# Patient Record
Sex: Female | Born: 1935 | Race: Black or African American | Hispanic: No | Marital: Single | Smoking: Former smoker
Health system: Southern US, Community
[De-identification: ages and names within clinical notes are randomized; demographics above are authoritative.]

## PROBLEM LIST (undated history)

## (undated) DIAGNOSIS — E876 Hypokalemia: Secondary | ICD-10-CM

## (undated) DIAGNOSIS — F0390 Unspecified dementia without behavioral disturbance: Secondary | ICD-10-CM

## (undated) DIAGNOSIS — G40909 Epilepsy, unspecified, not intractable, without status epilepticus: Secondary | ICD-10-CM

## (undated) DIAGNOSIS — F419 Anxiety disorder, unspecified: Secondary | ICD-10-CM

## (undated) DIAGNOSIS — D509 Iron deficiency anemia, unspecified: Secondary | ICD-10-CM

## (undated) DIAGNOSIS — K635 Polyp of colon: Secondary | ICD-10-CM

## (undated) DIAGNOSIS — E785 Hyperlipidemia, unspecified: Secondary | ICD-10-CM

## (undated) DIAGNOSIS — F329 Major depressive disorder, single episode, unspecified: Secondary | ICD-10-CM

## (undated) DIAGNOSIS — I10 Essential (primary) hypertension: Secondary | ICD-10-CM

## (undated) DIAGNOSIS — F32A Depression, unspecified: Secondary | ICD-10-CM

## (undated) HISTORY — DX: Epilepsy, unspecified, not intractable, without status epilepticus: G40.909

## (undated) HISTORY — DX: Anxiety disorder, unspecified: F41.9

## (undated) HISTORY — DX: Iron deficiency anemia, unspecified: D50.9

## (undated) HISTORY — DX: Depression, unspecified: F32.A

## (undated) HISTORY — DX: Hypokalemia: E87.6

## (undated) HISTORY — DX: Polyp of colon: K63.5

## (undated) HISTORY — DX: Major depressive disorder, single episode, unspecified: F32.9

## (undated) HISTORY — DX: Hyperlipidemia, unspecified: E78.5

---

## 2007-09-16 ENCOUNTER — Emergency Department (HOSPITAL_COMMUNITY): Admission: EM | Admit: 2007-09-16 | Discharge: 2007-09-16 | Payer: Self-pay | Admitting: Emergency Medicine

## 2009-10-23 DIAGNOSIS — D509 Iron deficiency anemia, unspecified: Secondary | ICD-10-CM

## 2009-10-23 HISTORY — DX: Iron deficiency anemia, unspecified: D50.9

## 2009-11-05 ENCOUNTER — Inpatient Hospital Stay (HOSPITAL_COMMUNITY): Admission: EM | Admit: 2009-11-05 | Discharge: 2009-11-08 | Payer: Self-pay | Admitting: Emergency Medicine

## 2010-04-07 ENCOUNTER — Emergency Department (HOSPITAL_COMMUNITY): Admission: EM | Admit: 2010-04-07 | Discharge: 2010-04-07 | Payer: Self-pay | Admitting: Emergency Medicine

## 2010-05-06 ENCOUNTER — Emergency Department (HOSPITAL_COMMUNITY): Admission: EM | Admit: 2010-05-06 | Discharge: 2010-05-06 | Payer: Self-pay | Admitting: Emergency Medicine

## 2010-07-19 ENCOUNTER — Emergency Department (HOSPITAL_COMMUNITY)
Admission: EM | Admit: 2010-07-19 | Discharge: 2010-07-19 | Payer: Self-pay | Source: Home / Self Care | Admitting: Emergency Medicine

## 2010-10-10 ENCOUNTER — Inpatient Hospital Stay (HOSPITAL_COMMUNITY)
Admission: EM | Admit: 2010-10-10 | Discharge: 2010-10-13 | Payer: Self-pay | Source: Home / Self Care | Attending: Internal Medicine | Admitting: Internal Medicine

## 2010-10-11 ENCOUNTER — Encounter (INDEPENDENT_AMBULATORY_CARE_PROVIDER_SITE_OTHER): Payer: Self-pay | Admitting: Internal Medicine

## 2010-10-12 ENCOUNTER — Encounter (INDEPENDENT_AMBULATORY_CARE_PROVIDER_SITE_OTHER): Payer: Self-pay | Admitting: Internal Medicine

## 2010-11-13 ENCOUNTER — Encounter: Payer: Self-pay | Admitting: Family Medicine

## 2011-01-02 LAB — URINALYSIS, ROUTINE W REFLEX MICROSCOPIC
Bilirubin Urine: NEGATIVE
Ketones, ur: NEGATIVE mg/dL
Nitrite: NEGATIVE
Protein, ur: NEGATIVE mg/dL

## 2011-01-02 LAB — CBC
HCT: 31.8 % — ABNORMAL LOW (ref 36.0–46.0)
Hemoglobin: 9.8 g/dL — ABNORMAL LOW (ref 12.0–15.0)
MCHC: 30.8 g/dL (ref 30.0–36.0)
MCHC: 31.5 g/dL (ref 30.0–36.0)
MCV: 74.2 fL — ABNORMAL LOW (ref 78.0–100.0)
Platelets: 385 10*3/uL (ref 150–400)
RBC: 4.28 MIL/uL (ref 3.87–5.11)
RDW: 17.3 % — ABNORMAL HIGH (ref 11.5–15.5)
WBC: 5.3 10*3/uL (ref 4.0–10.5)
WBC: 8.8 10*3/uL (ref 4.0–10.5)

## 2011-01-02 LAB — BASIC METABOLIC PANEL
BUN: 13 mg/dL (ref 6–23)
BUN: 16 mg/dL (ref 6–23)
CO2: 29 mEq/L (ref 19–32)
Creatinine, Ser: 0.71 mg/dL (ref 0.4–1.2)
GFR calc non Af Amer: >60 mL/min (ref >60)
GFR calc non Af Amer: >60 mL/min (ref >60)
Glucose, Bld: 148 mg/dL — ABNORMAL HIGH (ref 70–99)
Glucose, Bld: 72 mg/dL (ref 70–99)
Potassium: 3.9 mEq/L (ref 3.5–5.1)

## 2011-01-02 LAB — CK TOTAL AND CKMB (NOT AT ARMC)
CK, MB: 1.2 ng/mL (ref 0.3–4.0)
Total CK: 48 U/L (ref 7–177)

## 2011-01-02 LAB — CARDIAC PANEL(CRET KIN+CKTOT+MB+TROPI)
CK, MB: 1 ng/mL (ref 0.3–4.0)
CK, MB: 1.3 ng/mL (ref 0.3–4.0)
Relative Index: INVALID (ref 0.0–2.5)
Total CK: 51 U/L (ref 7–177)

## 2011-01-02 LAB — HEMOGLOBIN A1C
Hgb A1c MFr Bld: 6.6 % — ABNORMAL HIGH (ref <5.7)
Mean Plasma Glucose: 143 mg/dL — ABNORMAL HIGH (ref <117)

## 2011-01-02 LAB — DIFFERENTIAL
Basophils Absolute: 0.1 10*3/uL (ref 0.0–0.1)
Basophils Relative: 1 % (ref 0–1)
Eosinophils Relative: 1 % (ref 0–5)
Lymphocytes Relative: 48 % — ABNORMAL HIGH (ref 12–46)
Monocytes Absolute: 0.8 10*3/uL (ref 0.1–1.0)
Neutro Abs: 3.7 10*3/uL (ref 1.7–7.7)

## 2011-01-02 LAB — GLUCOSE, CAPILLARY
Glucose-Capillary: 102 mg/dL — ABNORMAL HIGH (ref 70–99)
Glucose-Capillary: 135 mg/dL — ABNORMAL HIGH (ref 70–99)
Glucose-Capillary: 148 mg/dL — ABNORMAL HIGH (ref 70–99)
Glucose-Capillary: 188 mg/dL — ABNORMAL HIGH (ref 70–99)
Glucose-Capillary: 90 mg/dL (ref 70–99)
Glucose-Capillary: 91 mg/dL (ref 70–99)
Glucose-Capillary: 94 mg/dL (ref 70–99)

## 2011-01-02 LAB — POCT CARDIAC MARKERS: Troponin i, poc: <0.05 ng/mL (ref 0.00–0.09)

## 2011-01-02 LAB — ALT: ALT: 12 U/L (ref 0–35)

## 2011-01-02 LAB — MRSA PCR SCREENING: MRSA by PCR: NEGATIVE

## 2011-01-02 LAB — AST: AST: 17 U/L (ref 0–37)

## 2011-01-05 LAB — URINALYSIS, ROUTINE W REFLEX MICROSCOPIC
Bilirubin Urine: NEGATIVE
Hgb urine dipstick: NEGATIVE
Ketones, ur: NEGATIVE mg/dL
Nitrite: NEGATIVE
Protein, ur: NEGATIVE mg/dL
Urobilinogen, UA: 1 mg/dL (ref 0.0–1.0)

## 2011-01-05 LAB — DIFFERENTIAL
Basophils Absolute: 0 10*3/uL (ref 0.0–0.1)
Lymphocytes Relative: 21 % (ref 12–46)
Monocytes Absolute: 0.6 10*3/uL (ref 0.1–1.0)
Neutro Abs: 3.9 10*3/uL (ref 1.7–7.7)
Neutrophils Relative %: 68 % (ref 43–77)

## 2011-01-05 LAB — BASIC METABOLIC PANEL
BUN: 13 mg/dL (ref 6–23)
CO2: 31 mEq/L (ref 19–32)
Chloride: 105 mEq/L (ref 96–112)
Glucose, Bld: 123 mg/dL — ABNORMAL HIGH (ref 70–99)
Potassium: 3.6 mEq/L (ref 3.5–5.1)
Sodium: 142 mEq/L (ref 135–145)

## 2011-01-05 LAB — POCT CARDIAC MARKERS
CKMB, poc: <1 ng/mL — ABNORMAL LOW (ref 1.0–8.0)
Myoglobin, poc: 59.9 ng/mL (ref 12–200)
Troponin i, poc: <0.05 ng/mL (ref 0.00–0.09)

## 2011-01-05 LAB — CBC
HCT: 32.7 % — ABNORMAL LOW (ref 36.0–46.0)
Hemoglobin: 10.6 g/dL — ABNORMAL LOW (ref 12.0–15.0)
MCHC: 32.5 g/dL (ref 30.0–36.0)
MCV: 77.3 fL — ABNORMAL LOW (ref 78.0–100.0)
RDW: 17.1 % — ABNORMAL HIGH (ref 11.5–15.5)

## 2011-01-07 LAB — COMPREHENSIVE METABOLIC PANEL
ALT: 11 U/L (ref 0–35)
AST: 16 U/L (ref 0–37)
CO2: 30 mEq/L (ref 19–32)
Calcium: 9.2 mg/dL (ref 8.4–10.5)
Chloride: 99 mEq/L (ref 96–112)
Creatinine, Ser: 0.69 mg/dL (ref 0.4–1.2)
GFR calc non Af Amer: >60 mL/min (ref >60)
Glucose, Bld: 120 mg/dL — ABNORMAL HIGH (ref 70–99)
Total Bilirubin: 0.3 mg/dL (ref 0.3–1.2)

## 2011-01-07 LAB — CBC
MCH: 25 pg — ABNORMAL LOW (ref 26.0–34.0)
MCHC: 32 g/dL (ref 30.0–36.0)
MCV: 78.1 fL (ref 78.0–100.0)
Platelets: 314 10*3/uL (ref 150–400)

## 2011-01-07 LAB — VALPROIC ACID LEVEL: Valproic Acid Lvl: 28.6 ug/mL — ABNORMAL LOW (ref 50.0–100.0)

## 2011-01-07 LAB — DIFFERENTIAL
Basophils Absolute: 0 10*3/uL (ref 0.0–0.1)
Eosinophils Absolute: 0 10*3/uL (ref 0.0–0.7)
Eosinophils Relative: 1 % (ref 0–5)
Lymphs Abs: 1.5 10*3/uL (ref 0.7–4.0)
Neutrophils Relative %: 69 % (ref 43–77)

## 2011-01-07 LAB — TROPONIN I: Troponin I: 0.01 ng/mL (ref 0.00–0.06)

## 2011-01-07 LAB — CK TOTAL AND CKMB (NOT AT ARMC): Relative Index: INVALID (ref 0.0–2.5)

## 2011-01-08 LAB — POCT CARDIAC MARKERS
CKMB, poc: 1.2 ng/mL (ref 1.0–8.0)
CKMB, poc: <1 ng/mL — ABNORMAL LOW (ref 1.0–8.0)
Myoglobin, poc: 133 ng/mL (ref 12–200)
Myoglobin, poc: 42 ng/mL (ref 12–200)

## 2011-01-08 LAB — CBC
Hemoglobin: 9.6 g/dL — ABNORMAL LOW (ref 12.0–15.0)
MCHC: 31.6 g/dL (ref 30.0–36.0)
MCV: 77.7 fL — ABNORMAL LOW (ref 78.0–100.0)
Platelets: 302 10*3/uL (ref 150–400)
Platelets: 357 10*3/uL (ref 150–400)
RDW: 17.9 % — ABNORMAL HIGH (ref 11.5–15.5)
WBC: 6.5 10*3/uL (ref 4.0–10.5)

## 2011-01-08 LAB — IRON AND TIBC
Iron: <10 ug/dL — ABNORMAL LOW (ref 42–135)
UIBC: 360 ug/dL

## 2011-01-08 LAB — BASIC METABOLIC PANEL
BUN: 14 mg/dL (ref 6–23)
Calcium: 9 mg/dL (ref 8.4–10.5)
Creatinine, Ser: 0.68 mg/dL (ref 0.4–1.2)
GFR calc Af Amer: >60 mL/min (ref >60)
GFR calc non Af Amer: >60 mL/min (ref >60)

## 2011-01-08 LAB — GLUCOSE, CAPILLARY
Glucose-Capillary: 113 mg/dL — ABNORMAL HIGH (ref 70–99)
Glucose-Capillary: 114 mg/dL — ABNORMAL HIGH (ref 70–99)
Glucose-Capillary: 158 mg/dL — ABNORMAL HIGH (ref 70–99)

## 2011-01-08 LAB — FOLATE: Folate: 16.3 ng/mL

## 2011-01-08 LAB — URINALYSIS, ROUTINE W REFLEX MICROSCOPIC
Glucose, UA: NEGATIVE mg/dL
Glucose, UA: NEGATIVE mg/dL
Hgb urine dipstick: NEGATIVE
Protein, ur: 30 mg/dL — AB
Protein, ur: NEGATIVE mg/dL
Specific Gravity, Urine: 1.019 (ref 1.005–1.030)
Urobilinogen, UA: 1 mg/dL (ref 0.0–1.0)

## 2011-01-08 LAB — DIFFERENTIAL
Basophils Relative: 0 % (ref 0–1)
Eosinophils Absolute: 0 10*3/uL (ref 0.0–0.7)
Eosinophils Absolute: 0.1 10*3/uL (ref 0.0–0.7)
Eosinophils Relative: 0 % (ref 0–5)
Lymphs Abs: 1.6 10*3/uL (ref 0.7–4.0)
Lymphs Abs: 2.5 10*3/uL (ref 0.7–4.0)
Monocytes Absolute: 1 10*3/uL (ref 0.1–1.0)
Monocytes Relative: 14 % — ABNORMAL HIGH (ref 3–12)
Neutro Abs: 3 10*3/uL (ref 1.7–7.7)
Neutrophils Relative %: 65 % (ref 43–77)
Neutrophils Relative %: 47 % (ref 43–77)

## 2011-01-08 LAB — URINE MICROSCOPIC-ADD ON

## 2011-01-08 LAB — RETICULOCYTES: RBC.: 4.25 MIL/uL (ref 3.87–5.11)

## 2011-01-08 LAB — URINE CULTURE: Culture: NO GROWTH

## 2011-01-08 LAB — COMPREHENSIVE METABOLIC PANEL
ALT: 19 U/L (ref 0–35)
AST: 28 U/L (ref 0–37)
Albumin: 3.5 g/dL (ref 3.5–5.2)
Alkaline Phosphatase: 71 U/L (ref 39–117)
Calcium: 8.8 mg/dL (ref 8.4–10.5)
GFR calc Af Amer: 54 mL/min — ABNORMAL LOW (ref >60)
Potassium: 3.7 mEq/L (ref 3.5–5.1)
Sodium: 141 mEq/L (ref 135–145)
Total Protein: 6.5 g/dL (ref 6.0–8.3)

## 2011-01-08 LAB — FERRITIN: Ferritin: 26 ng/mL (ref 10–291)

## 2011-01-09 LAB — GLUCOSE, CAPILLARY
Glucose-Capillary: 121 mg/dL — ABNORMAL HIGH (ref 70–99)
Glucose-Capillary: 89 mg/dL (ref 70–99)

## 2011-01-09 LAB — BASIC METABOLIC PANEL
Calcium: 8.6 mg/dL (ref 8.4–10.5)
GFR calc Af Amer: >60 mL/min (ref >60)
GFR calc non Af Amer: >60 mL/min (ref >60)
Sodium: 144 mEq/L (ref 135–145)

## 2011-01-09 LAB — POTASSIUM: Potassium: 3.9 mEq/L (ref 3.5–5.1)

## 2011-08-01 LAB — I-STAT 8, (EC8 V) (CONVERTED LAB)
BUN: 8
Chloride: 106
HCT: 40
Hemoglobin: 13.6
Operator id: 198171
Potassium: 3.4 — ABNORMAL LOW
Sodium: 140
pCO2, Ven: 44.9 — ABNORMAL LOW

## 2011-08-01 LAB — DIFFERENTIAL
Eosinophils Absolute: 0 — ABNORMAL LOW
Eosinophils Relative: 1
Lymphs Abs: 1.7
Monocytes Absolute: 0.5

## 2011-08-01 LAB — CBC
HCT: 36.2
MCV: 77.8 — ABNORMAL LOW
Platelets: 332
WBC: 5.4

## 2011-08-01 LAB — POCT I-STAT CREATININE
Creatinine, Ser: 1
Operator id: 198171

## 2011-08-01 LAB — POCT CARDIAC MARKERS: Operator id: 198171

## 2011-08-01 LAB — URINALYSIS, ROUTINE W REFLEX MICROSCOPIC
Glucose, UA: NEGATIVE
Hgb urine dipstick: NEGATIVE
Ketones, ur: NEGATIVE
Protein, ur: NEGATIVE

## 2011-08-27 ENCOUNTER — Other Ambulatory Visit: Payer: Self-pay

## 2011-08-27 ENCOUNTER — Inpatient Hospital Stay (HOSPITAL_COMMUNITY)
Admission: EM | Admit: 2011-08-27 | Discharge: 2011-09-01 | DRG: 312 | Disposition: A | Discharge status: Skilled Nursing Facility | Payer: Medicare Other | Attending: Internal Medicine | Admitting: Internal Medicine

## 2011-08-27 ENCOUNTER — Encounter: Payer: Self-pay | Admitting: Emergency Medicine

## 2011-08-27 ENCOUNTER — Emergency Department (HOSPITAL_COMMUNITY): Payer: Medicare Other

## 2011-08-27 DIAGNOSIS — Z7982 Long term (current) use of aspirin: Secondary | ICD-10-CM

## 2011-08-27 DIAGNOSIS — Y998 Other external cause status: Secondary | ICD-10-CM

## 2011-08-27 DIAGNOSIS — E1169 Type 2 diabetes mellitus with other specified complication: Secondary | ICD-10-CM | POA: Diagnosis not present

## 2011-08-27 DIAGNOSIS — G40909 Epilepsy, unspecified, not intractable, without status epilepticus: Secondary | ICD-10-CM | POA: Diagnosis present

## 2011-08-27 DIAGNOSIS — W1809XA Striking against other object with subsequent fall, initial encounter: Secondary | ICD-10-CM | POA: Diagnosis present

## 2011-08-27 DIAGNOSIS — Z9181 History of falling: Secondary | ICD-10-CM

## 2011-08-27 DIAGNOSIS — Z23 Encounter for immunization: Secondary | ICD-10-CM

## 2011-08-27 DIAGNOSIS — R55 Syncope and collapse: Principal | ICD-10-CM | POA: Diagnosis present

## 2011-08-27 DIAGNOSIS — R5381 Other malaise: Secondary | ICD-10-CM | POA: Diagnosis present

## 2011-08-27 DIAGNOSIS — F0390 Unspecified dementia without behavioral disturbance: Secondary | ICD-10-CM | POA: Diagnosis present

## 2011-08-27 DIAGNOSIS — I498 Other specified cardiac arrhythmias: Secondary | ICD-10-CM | POA: Diagnosis present

## 2011-08-27 DIAGNOSIS — R32 Unspecified urinary incontinence: Secondary | ICD-10-CM | POA: Diagnosis present

## 2011-08-27 DIAGNOSIS — Y92009 Unspecified place in unspecified non-institutional (private) residence as the place of occurrence of the external cause: Secondary | ICD-10-CM

## 2011-08-27 DIAGNOSIS — D509 Iron deficiency anemia, unspecified: Secondary | ICD-10-CM | POA: Diagnosis present

## 2011-08-27 DIAGNOSIS — I10 Essential (primary) hypertension: Secondary | ICD-10-CM | POA: Diagnosis present

## 2011-08-27 DIAGNOSIS — E876 Hypokalemia: Secondary | ICD-10-CM | POA: Diagnosis present

## 2011-08-27 DIAGNOSIS — IMO0002 Reserved for concepts with insufficient information to code with codable children: Secondary | ICD-10-CM | POA: Diagnosis present

## 2011-08-27 DIAGNOSIS — R4182 Altered mental status, unspecified: Secondary | ICD-10-CM | POA: Diagnosis present

## 2011-08-27 DIAGNOSIS — Z79899 Other long term (current) drug therapy: Secondary | ICD-10-CM

## 2011-08-27 HISTORY — DX: Unspecified dementia, unspecified severity, without behavioral disturbance, psychotic disturbance, mood disturbance, and anxiety: F03.90

## 2011-08-27 HISTORY — DX: Essential (primary) hypertension: I10

## 2011-08-27 LAB — CBC
HCT: 33.1 % — ABNORMAL LOW (ref 36.0–46.0)
HCT: 33.9 % — ABNORMAL LOW (ref 36.0–46.0)
Hemoglobin: 10.3 g/dL — ABNORMAL LOW (ref 12.0–15.0)
MCH: 22.8 pg — ABNORMAL LOW (ref 26.0–34.0)
MCHC: 31.1 g/dL (ref 30.0–36.0)
MCV: 72.7 fL — ABNORMAL LOW (ref 78.0–100.0)
Platelets: 420 10*3/uL — ABNORMAL HIGH (ref 150–400)
RBC: 4.52 MIL/uL (ref 3.87–5.11)
RBC: 4.66 MIL/uL (ref 3.87–5.11)
RDW: 16.7 % — ABNORMAL HIGH (ref 11.5–15.5)
WBC: 6.6 10*3/uL (ref 4.0–10.5)

## 2011-08-27 LAB — LIPID PANEL
HDL: 79 mg/dL (ref >39)
LDL Cholesterol: 107 mg/dL — ABNORMAL HIGH (ref 0–99)
Total CHOL/HDL Ratio: 2.5 RATIO
Triglycerides: 74 mg/dL (ref <150)
VLDL: 15 mg/dL (ref 0–40)

## 2011-08-27 LAB — CARDIAC PANEL(CRET KIN+CKTOT+MB+TROPI)
CK, MB: 1.8 ng/mL (ref 0.3–4.0)
Relative Index: INVALID (ref 0.0–2.5)
Total CK: 58 U/L (ref 7–177)
Troponin I: <0.3 ng/mL (ref <0.30)

## 2011-08-27 LAB — POCT I-STAT, CHEM 8
BUN: 14 mg/dL (ref 6–23)
Calcium, Ion: 1.16 mmol/L (ref 1.12–1.32)
HCT: 35 % — ABNORMAL LOW (ref 36.0–46.0)
Hemoglobin: 11.9 g/dL — ABNORMAL LOW (ref 12.0–15.0)
Sodium: 142 mEq/L (ref 135–145)
TCO2: 29 mmol/L (ref 0–100)

## 2011-08-27 LAB — URINALYSIS, ROUTINE W REFLEX MICROSCOPIC
Glucose, UA: NEGATIVE mg/dL
Ketones, ur: NEGATIVE mg/dL
Leukocytes, UA: NEGATIVE
Protein, ur: NEGATIVE mg/dL
pH: 7.5 (ref 5.0–8.0)

## 2011-08-27 LAB — PROTIME-INR: INR: 0.93 (ref 0.00–1.49)

## 2011-08-27 LAB — COMPREHENSIVE METABOLIC PANEL
AST: 18 U/L (ref 0–37)
Albumin: 3.7 g/dL (ref 3.5–5.2)
Alkaline Phosphatase: 111 U/L (ref 39–117)
CO2: 28 mEq/L (ref 19–32)
Chloride: 101 mEq/L (ref 96–112)
GFR calc non Af Amer: >90 mL/min (ref >90)
Potassium: 3.3 mEq/L — ABNORMAL LOW (ref 3.5–5.1)
Total Bilirubin: 0.2 mg/dL — ABNORMAL LOW (ref 0.3–1.2)

## 2011-08-27 LAB — URINE MICROSCOPIC-ADD ON

## 2011-08-27 LAB — VALPROIC ACID LEVEL: Valproic Acid Lvl: 11.9 ug/mL — ABNORMAL LOW (ref 50.0–100.0)

## 2011-08-27 LAB — POCT I-STAT TROPONIN I: Troponin i, poc: 0.01 ng/mL (ref 0.00–0.08)

## 2011-08-27 LAB — OCCULT BLOOD, POC DEVICE: Fecal Occult Bld: NEGATIVE

## 2011-08-27 LAB — APTT: aPTT: 27 seconds (ref 24–37)

## 2011-08-27 MED ORDER — POLYETHYLENE GLYCOL 3350 17 G PO PACK
17.0000 g | PACK | Freq: Every day | ORAL | Status: DC
  Administered 2011-08-28: 17 g via ORAL
  Administered 2011-08-29: 10:00:00 via ORAL
  Administered 2011-08-30 – 2011-08-31 (×2): 17 g via ORAL
  Administered 2011-09-01: 10:00:00 via ORAL
  Filled 2011-08-27 (×6): qty 1

## 2011-08-27 MED ORDER — SENNA 8.6 MG PO TABS
2.0000 | ORAL_TABLET | Freq: Every day | ORAL | Status: DC | PRN
  Filled 2011-08-27: qty 2

## 2011-08-27 MED ORDER — ENOXAPARIN SODIUM 40 MG/0.4ML ~~LOC~~ SOLN
40.0000 mg | SUBCUTANEOUS | Status: DC
  Administered 2011-08-28 – 2011-08-31 (×4): 40 mg via SUBCUTANEOUS
  Filled 2011-08-27 (×6): qty 0.4

## 2011-08-27 MED ORDER — DONEPEZIL HCL 10 MG PO TABS
10.0000 mg | ORAL_TABLET | Freq: Every day | ORAL | Status: DC
  Administered 2011-08-27 – 2011-08-31 (×6): 10 mg via ORAL
  Filled 2011-08-27 (×7): qty 1

## 2011-08-27 MED ORDER — ONDANSETRON HCL 4 MG/2ML IJ SOLN: 4.0000 mg | Freq: Four times a day (QID) | INTRAMUSCULAR | Status: DC | PRN

## 2011-08-27 MED ORDER — VITAMIN D3 25 MCG (1000 UNIT) PO TABS
1000.0000 [IU] | ORAL_TABLET | Freq: Every day | ORAL | Status: DC
  Administered 2011-08-28 – 2011-09-01 (×5): 1000 [IU] via ORAL
  Filled 2011-08-27 (×7): qty 1

## 2011-08-27 MED ORDER — SERTRALINE HCL 50 MG PO TABS
50.0000 mg | ORAL_TABLET | Freq: Every day | ORAL | Status: DC
  Administered 2011-08-28 – 2011-09-01 (×5): 50 mg via ORAL
  Filled 2011-08-27 (×5): qty 1

## 2011-08-27 MED ORDER — SIMVASTATIN 5 MG PO TABS
5.0000 mg | ORAL_TABLET | Freq: Every day | ORAL | Status: DC
  Administered 2011-08-27 – 2011-08-31 (×5): 5 mg via ORAL
  Filled 2011-08-27 (×6): qty 1

## 2011-08-27 MED ORDER — DIVALPROEX SODIUM 125 MG PO CPSP
125.0000 mg | ORAL_CAPSULE | Freq: Two times a day (BID) | ORAL | Status: DC
  Filled 2011-08-27: qty 1

## 2011-08-27 MED ORDER — SODIUM CHLORIDE 0.9 % IV SOLN
INTRAVENOUS | Status: DC
  Administered 2011-08-28 – 2011-08-30 (×3): via INTRAVENOUS

## 2011-08-27 MED ORDER — ASPIRIN EC 81 MG PO TBEC
81.0000 mg | DELAYED_RELEASE_TABLET | Freq: Every day | ORAL | Status: DC
  Administered 2011-08-28 – 2011-09-01 (×5): 81 mg via ORAL
  Filled 2011-08-27 (×6): qty 1

## 2011-08-27 MED ORDER — DIVALPROEX SODIUM 125 MG PO DR TAB
125.0000 mg | DELAYED_RELEASE_TABLET | Freq: Two times a day (BID) | ORAL | Status: DC
  Administered 2011-08-27 – 2011-09-01 (×10): 125 mg via ORAL
  Filled 2011-08-27 (×11): qty 1

## 2011-08-27 MED ORDER — ACETAMINOPHEN 325 MG PO TABS: 650.0000 mg | ORAL_TABLET | ORAL | Status: DC | PRN

## 2011-08-27 MED ORDER — LISINOPRIL 20 MG PO TABS
20.0000 mg | ORAL_TABLET | Freq: Every day | ORAL | Status: DC
  Administered 2011-08-28 – 2011-09-01 (×5): 20 mg via ORAL
  Filled 2011-08-27 (×6): qty 1

## 2011-08-27 MED ORDER — FERROUS SULFATE 325 (65 FE) MG PO TABS
325.0000 mg | ORAL_TABLET | Freq: Two times a day (BID) | ORAL | Status: DC
  Administered 2011-08-28 – 2011-09-01 (×9): 325 mg via ORAL
  Filled 2011-08-27 (×11): qty 1

## 2011-08-27 MED ORDER — TETANUS-DIPHTH-ACELL PERTUSSIS 5-2.5-18.5 LF-MCG/0.5 IM SUSP
INTRAMUSCULAR | Status: AC
  Filled 2011-08-27: qty 0.5

## 2011-08-27 MED ORDER — TETANUS-DIPHTH-ACELL PERTUSSIS 5-2.5-18.5 LF-MCG/0.5 IM SUSP
0.5000 mL | Freq: Once | INTRAMUSCULAR | Status: AC
  Administered 2011-08-27: 0.5 mL via INTRAMUSCULAR

## 2011-08-27 MED ORDER — ZOLPIDEM TARTRATE 5 MG PO TABS: 5.0000 mg | ORAL_TABLET | Freq: Every evening | ORAL | Status: DC | PRN

## 2011-08-27 MED ORDER — ONDANSETRON HCL 4 MG PO TABS: 4.0000 mg | ORAL_TABLET | Freq: Four times a day (QID) | ORAL | Status: DC | PRN

## 2011-08-27 NOTE — ED Provider Notes (Signed)
History     CSN: [id number] Arrival date & time: 08/27/2011 11:51 AM   First MD Initiated Contact with Patient 08/27/11 1242      Chief Complaint  Patient presents with  . Fall    this am.  2nd fall in 1 week.   Chief complaint: Syncope with fall (Consider location/radiation/quality/duration/timing/severity/associated sxs/prior treatment) HPI Level V caveat: Patient demented. History is obtained from patient's daughter. Patient has suffered 2 syncopal events within the past week. Has fallen at the assisted-living facility no known injury except for abrasion at the bridge of nose. Past Medical History  Diagnosis Date  . Dementia   . Hypertension   . Diabetes mellitus     No past surgical history on file.  No family history on file.  History  Substance Use Topics  . Smoking status: Not on file  . Smokeless tobacco: Not on file  . Alcohol Use: No    OB History    Grav Para Term Preterm Abortions TAB SAB Ect Mult Living                  Review of Systems  Unable to perform ROS Cardiovascular:       Syncope  Skin:       Abrasion    Allergies  Review of patient's allergies indicates no known allergies.  Home Medications   Current Outpatient Rx  Name Route Sig Dispense Refill  . ACETAMINOPHEN 325 MG PO TABS Oral Take 650 mg by mouth every 4 (four) hours as needed. For pain.     . ASPIRIN EC 81 MG PO TBEC Oral Take 81 mg by mouth daily.      Marland Kitchen VITAMIN D 1000 UNITS PO TABS Oral Take 1,000 Units by mouth daily.      Marland Kitchen DIVALPROEX SODIUM 125 MG PO TBEC Oral Take 125 mg by mouth 2 (two) times daily.      . DONEPEZIL HCL 10 MG PO TABS Oral Take 10 mg by mouth at bedtime.     Marland Kitchen FERROUS SULFATE 325 (65 FE) MG PO TABS Oral Take 325 mg by mouth 2 (two) times daily with a meal.     . LISINOPRIL 20 MG PO TABS Oral Take 20 mg by mouth daily.      Marland Kitchen LORAZEPAM 0.5 MG PO TABS Oral Take 0.25 mg by mouth 2 (two) times daily.      Marland Kitchen METFORMIN HCL 1000 MG PO TABS Oral Take 1,000  mg by mouth daily with breakfast.      . METFORMIN HCL 850 MG PO TABS Oral Take 850 mg by mouth daily with breakfast.      . SERTRALINE HCL 50 MG PO TABS Oral Take 50 mg by mouth daily.      Marland Kitchen SIMVASTATIN 5 MG PO TABS Oral Take 5 mg by mouth at bedtime.        BP 188/67  Pulse 62  Temp(Src) 97.8 F (36.6 C) (Oral)  Resp 16  Ht 5\' 11"  (1.803 m)  Wt 180 lb (81.647 kg)  BMI 25.10 kg/m2  SpO2 98%  Physical Exam  Constitutional: She appears well-developed and well-nourished.  HENT:  Head: Normocephalic and atraumatic.  Right Ear: External ear normal.  Left Ear: External ear normal.  Mouth/Throat: Oropharynx is clear and moist.       Dime sized abrasion at the bridge of nose  Eyes: Conjunctivae are normal. Pupils are equal, round, and reactive to light.  Neck: Neck supple. No tracheal  deviation present. No thyromegaly present.  Cardiovascular: Normal rate and regular rhythm.   No murmur heard. Pulmonary/Chest: Effort normal and breath sounds normal.  Abdominal: Soft. Bowel sounds are normal. She exhibits no distension. There is no tenderness.       OBese  Genitourinary: Guaiac negative stool.       Rectal exam nontender stool grossly normal color  Musculoskeletal: Normal range of motion. She exhibits no edema and no tenderness.  Neurological: She is alert. Coordination normal.       Does not follow commands moves all extremities well cranial nerves II through XII grossly intact motor strength grossly 5 over 5 overall  Skin: Skin is warm and dry. No rash noted.  Psychiatric: She has a normal mood and affect.    ED Course  Procedures (including critical care time)  Labs Reviewed - No data to display No results found.   No diagnosis found.  Date: 08/27/2011  Rate: 55  Rhythm: sinus bradycardia  QRS Axis: left  Intervals: normal  ST/T Wave abnormalities: nonspecific ST changes  Conduction Disutrbances:none  Narrative Interpretation:   Old EKG Reviewed:  unchanged  Unchanged from 10/10/2010  MDM  Had lengthy discussion with patient's daughter. Plan is to admit patient to telemetry service. Suspicious for dysrhythmia Case discussed with Dr.Devine Plan admit telemetry Diagnosis syncope Tightness #2 fall        Doug Sou, MD 08/27/11 [phone]

## 2011-08-27 NOTE — H&P (Addendum)
PCP:  No primary provider on file.   DOA:  08/27/2011 11:51 AM  Chief Complaint:  Syncope  HPI: 77 year old female with history of HTN, dementia (from [place] [facility]), Diabetes, recurrent falls who was brought from ALF after syncopal episode. As per patient's daughter patient has lost consciousness for 1-2 minutes while trying to walk to the bathroom. She wasn't confused after regaining consciousness, there was no seizure like activities, no tongue biting but patient does have urinary incontinence unrelated to seizure disorder. Patient had no complaints of blurry vision or lightheadedness prior to passing out. As per daughter this is a fourth time she passed out in last few months but no recommendation was made for higher level of care such as skilled nursing facility. Patient has no complaints of abdominal pain or nausea or vomiting. No complaints of chest pain or palpitations or shortness of breath, no complaints of cough. Patient admitted to medicine service for further evaluation and management.  Allergies: No Known Allergies  Prior to Admission medications   Medication Sig Start Date End Date Taking? Authorizing Provider  acetaminophen (TYLENOL) 325 MG tablet Take 650 mg by mouth every 4 (four) hours as needed. For pain.    Yes Historical Provider, MD  aspirin EC 81 MG tablet Take 81 mg by mouth daily.     Yes Historical Provider, MD  cholecalciferol (VITAMIN D) 1000 UNITS tablet Take 1,000 Units by mouth daily.     Yes Historical Provider, MD  divalproex (DEPAKOTE) 125 MG DR tablet Take 125 mg by mouth 2 (two) times daily.     Yes Historical Provider, MD  donepezil (ARICEPT) 10 MG tablet Take 10 mg by mouth at bedtime.    Yes Historical Provider, MD  ferrous sulfate 325 (65 FE) MG tablet Take 325 mg by mouth 2 (two) times daily with a meal.    Yes Historical Provider, MD  lisinopril (PRINIVIL,ZESTRIL) 20 MG tablet Take 20 mg by mouth daily.     Yes Historical Provider, MD  LORazepam  (ATIVAN) 0.5 MG tablet Take 0.25 mg by mouth 2 (two) times daily.     Yes Historical Provider, MD  metFORMIN (GLUCOPHAGE) 1000 MG tablet Take 1,000 mg by mouth daily with breakfast.     Yes Historical Provider, MD  metFORMIN (GLUCOPHAGE) 850 MG tablet Take 850 mg by mouth daily with breakfast.     Yes Historical Provider, MD  sertraline (ZOLOFT) 50 MG tablet Take 50 mg by mouth daily.     Yes Historical Provider, MD  simvastatin (ZOCOR) 5 MG tablet Take 5 mg by mouth at bedtime.     Yes Historical Provider, MD    Past Medical History  Diagnosis Date  . Dementia   . Hypertension   . Diabetes mellitus     History reviewed. No pertinent past surgical history.  Social History:  does not have a smoking history on file. She does not have any smokeless tobacco history on file. She reports that she does not drink alcohol. Her drug history not on file.  History reviewed. No pertinent family history.  Review of Systems:  Constitutional: Denies fever, chills, diaphoresis, appetite change and fatigue.  HEENT: Denies photophobia, eye pain, redness, hearing loss, ear pain, congestion, sore throat, rhinorrhea, sneezing, mouth sores, trouble swallowing, neck pain, neck stiffness and tinnitus.   Respiratory: Denies SOB, DOE, cough, chest tightness,  and wheezing.   Cardiovascular: Denies chest pain, palpitations and leg swelling.  Gastrointestinal: Denies nausea, vomiting, abdominal pain, diarrhea, constipation,  blood in stool and abdominal distention.  Genitourinary: Denies dysuria, urgency, frequency, hematuria, flank pain and difficulty urinating.  Musculoskeletal: Denies myalgias, back pain, joint swelling, arthralgias and gait problem.  Skin: Denies pallor, rash and wound.  Neurological: Denies dizziness, seizures, weakness, light-headedness, numbness and headaches.  Hematological: Denies adenopathy. Easy bruising, personal or family bleeding history  Psychiatric/Behavioral: Denies suicidal  ideation, mood changes, confusion, nervousness, sleep disturbance and agitation   Physical Exam:  Filed Vitals:   08/27/11 1137 08/27/11 1140 08/27/11 1418  BP: 153/89 188/67 179/69  Pulse: 53 62 59  Temp:  97.8 F (36.6 C)   TempSrc:  Oral   Resp: 18 16   Height:  5\' 11"  (1.803 m)   Weight:  81.647 kg (180 lb)   SpO2:  98% 98%    Constitutional: Vital signs reviewed.  Patient is a well-developed and well-nourished in no acute distress and cooperative with exam. Alert and awake but disoriented and minimally verbal. Head: Normocephalic and atraumatic Ear: TM normal bilaterally Mouth: no erythema or exudates, MMM Eyes: PERRL, EOMI, conjunctivae normal, No scleral icterus.  Neck: Supple, Trachea midline normal ROM, No JVD, mass, thyromegaly, or carotid bruit present.  Cardiovascular: RRR, S1 normal, S2 normal, no MRG, pulses symmetric and intact bilaterally Pulmonary/Chest: CTAB, no wheezes, rales, or rhonchi Abdominal: Soft. Non-tender, non-distended, bowel sounds are normal, no masses, organomegaly, or guarding present.  GU: no CVA tenderness Musculoskeletal: No joint deformities, erythema, or stiffness, ROM full and no nontender Ext: no edema and no cyanosis, pulses palpable bilaterally (DP and PT) Hematology: no cervical, inginal, or axillary adenopathy.  Neurological: Alert, awake; Strenght is normal and symmetric bilaterally, cranial nerve II-XII are grossly intact, no focal motor deficit, sensory intact to light touch bilaterally.  Skin: Warm, dry and intact. No rash, cyanosis, or clubbing.  Psychiatric: Normal mood and affect.  Labs on Admission:  Results for orders placed during the hospital encounter of 08/27/11 (from the past 48 hour(s))  OCCULT BLOOD, POC DEVICE     Status: Normal   Collection Time   08/27/11  1:27 PM      Component Value Range Comment   Fecal Occult Bld NEGATIVE     URINALYSIS, ROUTINE W REFLEX MICROSCOPIC     Status: Abnormal   Collection Time    08/27/11  1:53 PM      Component Value Range Comment   Color, Urine YELLOW  YELLOW     Appearance CLOUDY (*) CLEAR     Specific Gravity, Urine 1.020  1.005 - 1.030     pH 7.5  5.0 - 8.0     Glucose, UA NEGATIVE  NEGATIVE (mg/dL)    Hgb urine dipstick SMALL (*) NEGATIVE     Bilirubin Urine NEGATIVE  NEGATIVE     Ketones, ur NEGATIVE  NEGATIVE (mg/dL)    Protein, ur NEGATIVE  NEGATIVE (mg/dL)    Urobilinogen, UA 1.0  0.0 - 1.0 (mg/dL)    Nitrite NEGATIVE  NEGATIVE     Leukocytes, UA NEGATIVE  NEGATIVE    URINE MICROSCOPIC-ADD ON     Status: Normal   Collection Time   08/27/11  1:53 PM      Component Value Range Comment   Squamous Epithelial / LPF RARE  RARE     WBC, UA 0-2  <3 (WBC/hpf)    RBC / HPF 7-10  <3 (RBC/hpf)   CBC     Status: Abnormal   Collection Time   08/27/11  2:00 PM  Component Value Range Comment   WBC 6.0  4.0 - 10.5 (K/uL)    RBC 4.52  3.87 - 5.11 (MIL/uL)    Hemoglobin 10.3 (*) 12.0 - 15.0 (g/dL)    HCT 16.1 (*) 09.6 - 46.0 (%)    MCV 73.2 (*) 78.0 - 100.0 (fL)    MCH 22.8 (*) 26.0 - 34.0 (pg)    MCHC 31.1  30.0 - 36.0 (g/dL)    RDW 04.5 (*) 40.9 - 15.5 (%)    Platelets 425 (*) 150 - 400 (K/uL)   POCT I-STAT TROPONIN I     Status: Normal   Collection Time   08/27/11  2:27 PM      Component Value Range Comment   Troponin i, poc 0.01  0.00 - 0.08 (ng/mL)    Comment 3            POCT I-STAT, CHEM 8     Status: Abnormal   Collection Time   08/27/11  2:30 PM      Component Value Range Comment   Sodium 142  135 - 145 (mEq/L)    Potassium 3.5  3.5 - 5.1 (mEq/L)    Chloride 103  96 - 112 (mEq/L)    BUN 14  6 - 23 (mg/dL)    Creatinine, Ser 8.11  0.50 - 1.10 (mg/dL)    Glucose, Bld 914 (*) 70 - 99 (mg/dL)    Calcium, Ion 7.82  1.12 - 1.32 (mmol/L)    TCO2 29  0 - 100 (mmol/L)    Hemoglobin 11.9 (*) 12.0 - 15.0 (g/dL)    HCT 95.6 (*) 21.3 - 46.0 (%)     Radiological Exams on Admission: No results found.  Assessment/Plan  Principal Problem:    *Syncope - unknown etiology although differential includes dehydration, vasovagal syncope versus drug induced (depakote/ativan). We will cycle cardiac enzyme for total of 3 sets to rule out cardiac arrythmia as a cause of syncope. We will also obtain fasting lipid panel, TSH and HgA1c for risk stratification. We will follow up on 2D-ECHO results.  Since patient is also taking Depakote for seizure disorder we will obtain Depakote level. We will discontinue ativan for now.  Active Problems:   HTN (hypertension) - BP at goal. Patient can continue taking home medications as per medical reconcilliation form.   Dementia - continue Aricept 10 mg PO daily.   Diabetes mellitus - We will discontinue metformin for now and start sliding scale insulin.  Advance directives - full code  Diet - regular  Social work - for placement to skilled nursing facility  DVT prophylaxis - Lovenox subcutaneously every 24 hours.    Time Spent on Admission: over 30 minutes  Ireoluwa Grant 08/27/2011, 3:39 PM

## 2011-08-28 DIAGNOSIS — D509 Iron deficiency anemia, unspecified: Secondary | ICD-10-CM | POA: Diagnosis present

## 2011-08-28 LAB — BASIC METABOLIC PANEL
BUN: 10 mg/dL (ref 6–23)
Calcium: 9.3 mg/dL (ref 8.4–10.5)
Creatinine, Ser: 0.54 mg/dL (ref 0.50–1.10)
GFR calc non Af Amer: >90 mL/min (ref >90)
Glucose, Bld: 108 mg/dL — ABNORMAL HIGH (ref 70–99)
Potassium: 3.2 mEq/L — ABNORMAL LOW (ref 3.5–5.1)

## 2011-08-28 LAB — VITAMIN B12: Vitamin B-12: 790 pg/mL (ref 211–911)

## 2011-08-28 LAB — CBC
Hemoglobin: 10.3 g/dL — ABNORMAL LOW (ref 12.0–15.0)
MCH: 22.7 pg — ABNORMAL LOW (ref 26.0–34.0)
MCHC: 31 g/dL (ref 30.0–36.0)
RDW: 16.6 % — ABNORMAL HIGH (ref 11.5–15.5)

## 2011-08-28 LAB — MRSA PCR SCREENING: MRSA by PCR: NEGATIVE

## 2011-08-28 LAB — GLUCOSE, CAPILLARY: Glucose-Capillary: 115 mg/dL — ABNORMAL HIGH (ref 70–99)

## 2011-08-28 LAB — CARDIAC PANEL(CRET KIN+CKTOT+MB+TROPI): Relative Index: INVALID (ref 0.0–2.5)

## 2011-08-28 MED ORDER — POTASSIUM CHLORIDE CRYS ER 20 MEQ PO TBCR
20.0000 meq | EXTENDED_RELEASE_TABLET | Freq: Once | ORAL | Status: AC
  Administered 2011-08-28: 20 meq via ORAL
  Filled 2011-08-28 (×2): qty 1

## 2011-08-28 MED ORDER — CHLORTHALIDONE 25 MG PO TABS
25.0000 mg | ORAL_TABLET | Freq: Every day | ORAL | Status: DC
  Administered 2011-08-28 – 2011-09-01 (×5): 25 mg via ORAL
  Filled 2011-08-28 (×5): qty 1

## 2011-08-28 MED ORDER — PNEUMOCOCCAL VAC POLYVALENT 25 MCG/0.5ML IJ INJ
0.5000 mL | INJECTION | INTRAMUSCULAR | Status: AC
  Filled 2011-08-28 (×2): qty 0.5

## 2011-08-28 MED ORDER — POTASSIUM CHLORIDE 20 MEQ PO PACK
20.0000 meq | PACK | Freq: Once | ORAL | Status: DC
  Filled 2011-08-28 (×2): qty 1

## 2011-08-28 NOTE — Progress Notes (Addendum)
Subjective:  No major events overnight. Patient's mental status better in terms of alertness.  Objective:  Vital signs in last 24 hours:  Filed Vitals:   08/27/11 1827 08/27/11 2100 08/28/11 0253 08/28/11 0702  BP: 179/77 156/76 173/93 183/73  Pulse: 57 58 64 58  Temp: 98.5 F (36.9 C) 98.7 F (37.1 C) 98.5 F (36.9 C) 98.3 F (36.8 C)  TempSrc: Oral Axillary Axillary Oral  Resp: 18 20 20 20   Height: 5\' 6"  (1.676 m)     Weight: 95 kg (209 lb 7 oz)     SpO2: 99% 95% 97% 98%    Intake/Output from previous day:   Intake/Output Summary (Last 24 hours) at 08/28/11 1038 Last data filed at 08/28/11 0900  Gross per 24 hour  Intake    120 ml  Output      0 ml  Net    120 ml    Physical Exam: General: Alert, awake, disoriented, in no acute distress. HEENT: No bruits, no goiter. Heart: Regular rate and rhythm, without murmurs, rubs, gallops. Lungs: Clear to auscultation bilaterally. Abdomen: Soft, nontender, nondistended, positive bowel sounds. Extremities: No clubbing cyanosis or edema with positive pedal pulses. Neuro: awake, alert; nonfocal.   Lab Results:  Basic Metabolic Panel:    Component Value Date/Time   NA 140 08/28/2011 0800   K 3.2* 08/28/2011 0800   CL 101 08/28/2011 0800   CO2 29 08/28/2011 0800   BUN 10 08/28/2011 0800   CREATININE 0.54 08/28/2011 0800   GLUCOSE 108* 08/28/2011 0800   CALCIUM 9.3 08/28/2011 0800   CBC:    Component Value Date/Time   WBC 4.6 08/28/2011 0800   HGB 10.3* 08/28/2011 0800   HCT 33.2* 08/28/2011 0800   PLT 398 08/28/2011 0800   MCV 73.1* 08/28/2011 0800   NEUTROABS 3.7 10/10/2010 1027   LYMPHSABS 4.2* 10/10/2010 1027   MONOABS 0.8 10/10/2010 1027   EOSABS 0.1 10/10/2010 1027   BASOSABS 0.1 10/10/2010 1027    Recent Results (from the past 240 hour(s))  MRSA PCR SCREENING     Status: Normal   Collection Time   08/28/11  3:52 AM      Component Value Range Status Comment   MRSA by PCR NEGATIVE  NEGATIVE  Final      Studies/Results: Ct Head Wo Contrast  08/27/2011    IMPRESSION: Stable atrophy and chronic small vessel ischemic changes.  No acute intracranial findings.      Medications: Scheduled Meds:   . aspirin EC  81 mg Oral Daily  . cholecalciferol  1,000 Units Oral Daily  . divalproex  125 mg Oral BID  . donepezil  10 mg Oral QHS  . enoxaparin  40 mg Subcutaneous Q24H  . ferrous sulfate  325 mg Oral BID WC  . lisinopril  20 mg Oral Daily  . pneumococcal 23 valent vaccine  0.5 mL Intramuscular Tomorrow-1000  . polyethylene glycol  17 g Oral Daily  . sertraline  50 mg Oral Daily  . simvastatin  5 mg Oral QHS  . TDaP      . TDaP  0.5 mL Intramuscular Once  . DISCONTD: divalproex  125 mg Oral BID   Continuous Infusions:   . sodium chloride 50 mL/hr at 08/28/11 0600   PRN Meds:.acetaminophen, ondansetron (ZOFRAN) IV, ondansetron, senna, zolpidem  Assessment/Plan:  Principal Problem:   *Syncope - patient had no more syncopal episodes. Cardiac enzymes are negative for total of 2 sets. Awaiting results of 2-D echo. Will  followup with physical therapy and occupational therapy recommendation as to what final disposition plan will be.  Active Problems:   HTN (hypertension) - continue lisinopril and we will add chlorthalidone 25 mg daily. Continue  to monitor blood pressure.   Dementia - PT and OT evaluation for safe discharge plan.   Diabetes mellitus - continue sliding scale insulin.   Seizure disorder - valproic acid level was about 12. Continue divalproex 125 mg twice daily.     LOS: 1 day   Jerre Vandrunen 08/28/2011, 10:38 AM

## 2011-08-28 NOTE — Plan of Care (Signed)
Consult received for nutrition education related to obesity.  Chart reviewed. Noted pt with dementia, currently resides in ALF.  Discussed with RN, pt alert, but not oriented, not appropriate for education at this time.  Pt would not likely be able to achieve weight loss due to inability to exercise, cook/serve own meals, and unable to set and monitor goals at this time.  Regular diet appropriate.  Please reconsult if RD can be of assistance.    Pager:  [phone]

## 2011-08-28 NOTE — Progress Notes (Signed)
  Echocardiogram 2D Echocardiogram has been performed.  Kjersti Dittmer Lynn Matas Burrows, RDCS 08/28/2011, 1:42 PM

## 2011-08-28 NOTE — Progress Notes (Signed)
Physical Therapy Evaluation Patient Details Name: Sheryl Jones MRN: [id number] DOB: [birth year] Today's Date: 08/28/2011 [id number] Darene Jones  Problem List:  Patient Active Problem List  Diagnoses  . Syncope  . Dementia  . HTN (hypertension)  . Diabetes mellitus  . Seizure disorder  . Microcytic anemia    Past Medical History:  Past Medical History  Diagnosis Date  . Dementia   . Hypertension   . Diabetes mellitus    Past Surgical History: History reviewed. No pertinent past surgical history.  PT Assessment/Plan/Recommendation PT Assessment Clinical Impression Statement: Patient admitted after syncopal episode from Assisted living memory care unit presents with need for skilled PT in the acute care setting to address decreased initiation and balance.  Will benefit from return to same level of care at dischage, though daughter requestin different facility. PT Recommendation/Assessment: Patient will need skilled PT in the acute care venue PT Problem List: Decreased cognition;Decreased balance Barriers to Discharge Comments: needs different memory care facility PT Therapy Diagnosis : Abnormality of gait PT Plan PT Frequency: Min 3X/week PT Treatment/Interventions: Gait training;Balance training;Patient/family education PT Recommendation Follow Up Recommendations: None Equipment Recommended: None recommended by PT PT Goals  Acute Rehab PT Goals PT Goal Formulation: With family Time For Goal Achievement: 7 days Pt will go Supine/Side to Sit: with min assist PT Goal: Supine/Side to Sit - Progress: Progressing toward goal Pt will Stand: with supervision;3 - 5 min;with no upper extremity support (performing meaningful task) PT Goal: Stand - Progress: Progressing toward goal Pt will Ambulate: with supervision PT Goal: Ambulate - Progress: Progressing toward goal  PT Evaluation Precautions/Restrictions  Precautions Precautions: Fall Prior Functioning    Prior Function Level  of Independence: Independent with gait;Needs assistance with ADLs Bath: Maximal Toileting: Maximal Dressing: Maximal Cognition Cognition Arousal/Alertness: Awake/alert Overall Cognitive Status: Impaired Attention: Impaired Current Attention Level: Focused Memory: Appears impaired Memory Deficits: Patient with advanced dementia Orientation Level: Disoriented X4 Following Commands: Follows one step commands with increased time (with moderate to maximal guidance and cues) Cognition - Other Comments: decreased initiation   Extremity Assessment RLE Assessment RLE Assessment: Within Functional Limits LLE Assessment LLE Assessment: Within Functional Limits Mobility (including Balance) Bed Mobility Bed Mobility: Yes Supine to Sit: 1: +2 Total assist Supine to Sit Details (indicate cue type and reason): for guidance with patient approximatly 30% Sitting - Scoot to Edge of Bed: 2: Max assist Sitting - Scoot to [place] of Bed Details (indicate cue type and reason): due to decreased initiation Transfers Transfers: Yes Sit to Stand: 3: Mod assist Sit to Stand Details (indicate cue type and reason): for initiation and forward weight shift Stand to Sit: 1: +2 Total assist;To toilet Stand to Sit Details: for guidance due to advanced dementia;  sat on bed with mod assist. Ambulation/Gait Ambulation/Gait: Yes Ambulation/Gait Assistance: 4: Min assist Ambulation/Gait Assistance Details (indicate cue type and reason): Patient needing guidance for direction changes and increased time to process after verbal cue/manual guidance given. Ambulation Distance (Feet): 350 Feet Assistive device: 1 person hand held assist Gait Pattern:  (hesitant due to decreased cognition)  Balance Balance Assessed: Yes Static Standing Balance Static Standing - Balance Support: No upper extremity supported Static Standing - Level of Assistance: 5: Stand by assistance (for approximately 2 minutes)  End of Session PT -  End of Session Equipment Utilized During Treatment: Gait belt Activity Tolerance: Patient tolerated treatment well Patient left: in bed;with family/visitor present Nurse Communication: Mobility status for transfers General Behavior During Session: [name] for tasks  performed Cognition: Impaired, at baseline  [name],CYNDI 08/28/2011, 3:10 PM

## 2011-08-29 LAB — BASIC METABOLIC PANEL
Calcium: 9.3 mg/dL (ref 8.4–10.5)
Chloride: 104 mEq/L (ref 96–112)
Creatinine, Ser: 0.61 mg/dL (ref 0.50–1.10)
GFR calc Af Amer: >90 mL/min (ref >90)

## 2011-08-29 LAB — CBC
Platelets: 392 10*3/uL (ref 150–400)
RDW: 16.7 % — ABNORMAL HIGH (ref 11.5–15.5)
WBC: 4.7 10*3/uL (ref 4.0–10.5)

## 2011-08-29 MED ORDER — HYDRALAZINE HCL 10 MG PO TABS
10.0000 mg | ORAL_TABLET | Freq: Three times a day (TID) | ORAL | Status: DC
  Administered 2011-08-29 – 2011-09-01 (×10): 10 mg via ORAL
  Filled 2011-08-29 (×12): qty 1

## 2011-08-29 MED ORDER — POTASSIUM CHLORIDE CRYS ER 20 MEQ PO TBCR
20.0000 meq | EXTENDED_RELEASE_TABLET | Freq: Once | ORAL | Status: AC
  Administered 2011-08-29: 20 meq via ORAL
  Filled 2011-08-29: qty 1

## 2011-08-29 NOTE — Progress Notes (Signed)
CSW assisting with Memory Care placement. Will continue to follow to assist with D?C planning.

## 2011-08-29 NOTE — Progress Notes (Signed)
Subjective:  No major events overnight.  Objective:  Vital signs in last 24 hours:  Filed Vitals:   08/28/11 1448 08/28/11 2121 08/29/11 0538 08/29/11 1057  BP: 137/72 165/54 183/95 152/83  Pulse: 59 61 55 76  Temp: 98.3 F (36.8 C) 98.8 F (37.1 C) 97.7 F (36.5 C) 97.3 F (36.3 C)  TempSrc: Axillary Oral Oral Axillary  Resp: 18 20 20 18   Height:      Weight:      SpO2: 98% 98% 98% 90%    Intake/Output from previous day:   Intake/Output Summary (Last 24 hours) at 08/29/11 1142 Last data filed at 08/29/11 1058  Gross per 24 hour  Intake   1550 ml  Output      0 ml  Net   1550 ml    Physical Exam: General: Alert, awake, oriented x3, in no acute distress. HEENT: No bruits, no goiter. Heart: Regular rate and rhythm, without murmurs, rubs, gallops. Lungs: Clear to auscultation bilaterally. Abdomen: Soft, nontender, nondistended, positive bowel sounds. Extremities: No clubbing cyanosis or edema with positive pedal pulses. Neuro: Grossly intact, nonfocal.   Lab Results:  Basic Metabolic Panel:    Component Value Date/Time   NA 142 08/29/2011 0501   K 3.3* 08/29/2011 0501   CL 104 08/29/2011 0501   CO2 30 08/29/2011 0501   BUN 11 08/29/2011 0501   CREATININE 0.61 08/29/2011 0501   GLUCOSE 99 08/29/2011 0501   CALCIUM 9.3 08/29/2011 0501   CBC:    Component Value Date/Time   WBC 4.7 08/29/2011 0501   HGB 9.6* 08/29/2011 0501   HCT 30.6* 08/29/2011 0501   PLT 392 08/29/2011 0501   MCV 72.5* 08/29/2011 0501   NEUTROABS 3.7 10/10/2010 1027   LYMPHSABS 4.2* 10/10/2010 1027   MONOABS 0.8 10/10/2010 1027   EOSABS 0.1 10/10/2010 1027   BASOSABS 0.1 10/10/2010 1027    Recent Results (from the past 240 hour(s))  MRSA PCR SCREENING     Status: Normal   Collection Time   08/28/11  3:52 AM      Component Value Range Status Comment   MRSA by PCR NEGATIVE  NEGATIVE  Final     Studies/Results: Ct Head Wo Contrast  08/27/2011   IMPRESSION: Stable atrophy and chronic  small vessel ischemic changes.  No acute intracranial findings.     Medications: Scheduled Meds:   . aspirin EC  81 mg Oral Daily  . chlorthalidone  25 mg Oral Daily  . cholecalciferol  1,000 Units Oral Daily  . divalproex  125 mg Oral BID  . donepezil  10 mg Oral QHS  . enoxaparin  40 mg Subcutaneous Q24H  . ferrous sulfate  325 mg Oral BID WC  . lisinopril  20 mg Oral Daily  . pneumococcal 23 valent vaccine  0.5 mL Intramuscular Tomorrow-1000  . polyethylene glycol  17 g Oral Daily  . potassium chloride  20 mEq Oral Once  . sertraline  50 mg Oral Daily  . simvastatin  5 mg Oral QHS  . DISCONTD: potassium chloride  20 mEq Oral Once   Continuous Infusions:   . sodium chloride 50 mL/hr at 08/28/11 0600   PRN Meds:.acetaminophen, ondansetron (ZOFRAN) IV, ondansetron, senna, zolpidem  Assessment/Plan:  Principal Problem:  *Syncope - patient has no complaints of lightheadedness or dizziness.  Active Problems:  HTN (hypertension) - please continue lisinopril  Microcytic anemia - hemoglobin stable at 9.6. No need for conscious urine at this time.  Dementia - No  change in mental status.  Diabetes mellitus - Continue sliding scale insulin.  Seizure disorder    LOS: 2 days   Britny Riel 08/29/2011, 11:42 AM

## 2011-08-29 NOTE — Progress Notes (Signed)
Discussed OT role with family and decision was made to defer OT eval to SNF. Acute OT signing off.

## 2011-08-30 DIAGNOSIS — E876 Hypokalemia: Secondary | ICD-10-CM | POA: Diagnosis present

## 2011-08-30 LAB — GLUCOSE, CAPILLARY: Glucose-Capillary: 115 mg/dL — ABNORMAL HIGH (ref 70–99)

## 2011-08-30 LAB — BASIC METABOLIC PANEL
GFR calc Af Amer: >90 mL/min (ref >90)
GFR calc non Af Amer: 87 mL/min — ABNORMAL LOW (ref >90)
Potassium: 3 mEq/L — ABNORMAL LOW (ref 3.5–5.1)
Sodium: 139 mEq/L (ref 135–145)

## 2011-08-30 LAB — CBC
Hemoglobin: 10 g/dL — ABNORMAL LOW (ref 12.0–15.0)
RBC: 4.44 MIL/uL (ref 3.87–5.11)

## 2011-08-30 MED ORDER — POTASSIUM CHLORIDE CRYS ER 20 MEQ PO TBCR
40.0000 meq | EXTENDED_RELEASE_TABLET | Freq: Once | ORAL | Status: AC
  Administered 2011-08-30: 40 meq via ORAL
  Filled 2011-08-30: qty 2

## 2011-08-30 MED ORDER — TUBERCULIN PPD 5 UNIT/0.1ML ID SOLN
5.0000 [IU] | Freq: Once | INTRADERMAL | Status: AC
  Administered 2011-08-30: 5 [IU] via INTRADERMAL
  Filled 2011-08-30: qty 0.1

## 2011-08-30 NOTE — Plan of Care (Signed)
Problem: Phase I Progression Outcomes Goal: Initial discharge plan identified Outcome: Progressing SW involved in finding placement with pt's daughters

## 2011-08-30 NOTE — Plan of Care (Signed)
Problem: Phase I Progression Outcomes Goal: OOB as tolerated unless otherwise ordered Outcome: Completed/Met Date Met:  08/30/11 Up with PT/ 2 staff assist

## 2011-08-30 NOTE — Progress Notes (Signed)
Family continues to tour Memory Care units. [facility] will screen pt in am. Will continue to assist with placement arrangements.

## 2011-08-30 NOTE — Plan of Care (Signed)
Problem: Consults Goal: General Medical Patient Education See Patient Education Module for specific education.  Outcome: Completed/Met Date Met:  08/30/11 With pt's daughters at bedside

## 2011-08-30 NOTE — Progress Notes (Signed)
Physical Therapy Treatment Patient Details Name: Sheryl Jones MRN: [id number] DOB: 03-02-1934 Today's Date: 08/30/2011 1250-1315 1Gt, 1Ta  PT Assessment/Plan  PT - Assessment/Plan Comments on Treatment Session: Patient able to participate with ambulation today with cues and +2 assistance.  Continues to need assistance for initiation of mobility but able to sit on bed unassisted, just given extra time and multiple cues. PT Plan: Discharge plan remains appropriate PT Frequency: Min 3X/week Follow Up Recommendations: None;Other (comment) (to Memory Care Unit at ALF.) Equipment Recommended: Defer to next venue PT Goals  Acute Rehab PT Goals PT Goal Formulation: With patient Time For Goal Achievement: 2 weeks PT Goal: Supine/Side to Sit - Progress: Progressing toward goal PT Goal: Stand - Progress: Progressing toward goal PT Goal: Ambulate - Progress: Progressing toward goal  PT Treatment Precautions/Restrictions  Precautions Precautions: Fall Restrictions Weight Bearing Restrictions: No Mobility (including Balance) Bed Mobility Supine to Sit: 1: +2 Total assist;Patient percentage (comment) (30%) Sitting - Scoot to Edge of Bed: 2: Max assist Sitting - Scoot to [employer] of Bed Details (indicate cue type and reason): assist pulling on pad under patient Transfers Transfers: Yes Sit to Stand: 1: +2 Total assist;Patient percentage (comment) (30%) Sit to Stand Details (indicate cue type and reason): cues with bilateral assistance to achieve anterior weight shift Stand to Sit: 1: +2 Total assist;Patient percentage (comment) (Patient resistive at times) Stand to Sit Details: able to achieve stand to sit x 1 with supervision with increased time and maximal verbal and demonstrative cues.  2 prior times to 3:1 and  Ambulation/Gait Ambulation/Gait: Yes Ambulation/Gait Assistance: 4: Min assist Ambulation/Gait Assistance Details (indicate cue type and reason): assistance for turning corners (course  of walking around nursing unit.) Ambulation Distance (Feet): 400 Feet Assistive device: None Gait Pattern: Decreased stride length  Static Standing Balance Static Standing - Balance Support: No upper extremity supported Static Standing - Level of Assistance: 5: Stand by assistance Exercise    End of Session PT - End of Session Equipment Utilized During Treatment: Gait belt Activity Tolerance: Patient tolerated treatment well  Rocsi Hazelbaker,CYNDI 08/30/2011, 2:19 PM

## 2011-08-30 NOTE — Progress Notes (Addendum)
Subjective:  No major events overnight. Patient mental status much better but patient is still disoriented.  Objective:  Vital signs in last 24 hours:  Filed Vitals:   08/29/11 1057 08/29/11 1349 08/29/11 2111 08/30/11 0523  BP: 152/83 181/82 114/63 163/86  Pulse: 76 68 61 62  Temp: 97.3 F (36.3 C) 98.9 F (37.2 C) 98.7 F (37.1 C) 99 F (37.2 C)  TempSrc: Axillary Axillary Oral Oral  Resp: 18 18 20 20   Height:      Weight:      SpO2: 90% 97% 98% 98%    Intake/Output from previous day:   Intake/Output Summary (Last 24 hours) at 08/30/11 0906 Last data filed at 08/30/11 0524  Gross per 24 hour  Intake   1410 ml  Output      0 ml  Net   1410 ml    Physical Exam: General: Alert, awake, in no acute distress. HEENT: No bruits, no goiter. Heart: Regular rate and rhythm, without murmurs, rubs, gallops. Lungs: Clear to auscultation bilaterally. Abdomen: Soft, nontender, nondistended, positive bowel sounds. Extremities: No clubbing cyanosis or edema with positive pedal pulses. Neuro: Grossly intact, nonfocal.   Lab Results:  Basic Metabolic Panel:    Component Value Date/Time   NA 139 08/30/2011 0530   K 3.0* 08/30/2011 0530   CL 102 08/30/2011 0530   CO2 28 08/30/2011 0530   BUN 10 08/30/2011 0530   CREATININE 0.61 08/30/2011 0530   GLUCOSE 103* 08/30/2011 0530   CALCIUM 9.4 08/30/2011 0530   CBC:    Component Value Date/Time   WBC 5.6 08/30/2011 0530   HGB 10.0* 08/30/2011 0530   HCT 32.2* 08/30/2011 0530   PLT 321 08/30/2011 0530   MCV 72.5* 08/30/2011 0530   NEUTROABS 3.7 10/10/2010 1027   LYMPHSABS 4.2* 10/10/2010 1027   MONOABS 0.8 10/10/2010 1027   EOSABS 0.1 10/10/2010 1027   BASOSABS 0.1 10/10/2010 1027    Recent Results (from the past 240 hour(s))  MRSA PCR SCREENING     Status: Normal   Collection Time   08/28/11  3:52 AM      Component Value Range Status Comment   MRSA by PCR NEGATIVE  NEGATIVE  Final     Studies/Results: No results  found.  Medications: Scheduled Meds:   . aspirin EC  81 mg Oral Daily  . chlorthalidone  25 mg Oral Daily  . cholecalciferol  1,000 Units Oral Daily  . divalproex  125 mg Oral BID  . donepezil  10 mg Oral QHS  . enoxaparin  40 mg Subcutaneous Q24H  . ferrous sulfate  325 mg Oral BID WC  . hydrALAZINE  10 mg Oral Q8H  . lisinopril  20 mg Oral Daily  . pneumococcal 23 valent vaccine  0.5 mL Intramuscular Tomorrow-1000  . polyethylene glycol  17 g Oral Daily  . potassium chloride  20 mEq Oral Once  . potassium chloride  40 mEq Oral Once  . sertraline  50 mg Oral Daily  . simvastatin  5 mg Oral QHS   Continuous Infusions:   . sodium chloride 50 mL/hr at 08/29/11 2331   PRN Meds:.acetaminophen, ondansetron (ZOFRAN) IV, ondansetron, senna, zolpidem  Assessment/Plan:  Principal Problem:  *Syncope - no syncopal episodes reported.  Active Problems:   HTN (hypertension) - continue lisinopril and chlorthalidone; started hydralazine 10 mg every 8 hours (08/29/11).   Microcytic anemia - hemoglobin stable, no need for blood transfusion.   Hypokalemia - unclear etiology; continue supplemental potassium.  Seizure disorder - continue divalproex   Disposition - awaiting placement to ALF versus home if no bed available at ALF secure memory unit.  Will place TB skin test 08/30/2011.    LOS: 3 days   Sheryl Jones 08/30/2011, 9:06 AM

## 2011-08-31 LAB — BASIC METABOLIC PANEL
BUN: 10 mg/dL (ref 6–23)
CO2: 30 mEq/L (ref 19–32)
Chloride: 103 mEq/L (ref 96–112)
Glucose, Bld: 106 mg/dL — ABNORMAL HIGH (ref 70–99)
Potassium: 3.1 mEq/L — ABNORMAL LOW (ref 3.5–5.1)

## 2011-08-31 LAB — CBC
HCT: 32.2 % — ABNORMAL LOW (ref 36.0–46.0)
Hemoglobin: 10.1 g/dL — ABNORMAL LOW (ref 12.0–15.0)
MCH: 22.9 pg — ABNORMAL LOW (ref 26.0–34.0)
MCHC: 31.4 g/dL (ref 30.0–36.0)

## 2011-08-31 MED ORDER — POTASSIUM CHLORIDE CRYS ER 20 MEQ PO TBCR
40.0000 meq | EXTENDED_RELEASE_TABLET | Freq: Once | ORAL | Status: AC
  Administered 2011-08-31: 40 meq via ORAL
  Filled 2011-08-31: qty 2

## 2011-08-31 MED ORDER — POTASSIUM CHLORIDE 20 MEQ PO PACK
40.0000 meq | PACK | Freq: Once | ORAL | Status: DC
  Filled 2011-08-31: qty 2

## 2011-08-31 NOTE — Progress Notes (Signed)
Subjective: Unable to obtain any history , patient confused,  Objective: Vital signs in last 24 hours: Filed Vitals:   08/30/11 1000 08/30/11 1330 08/30/11 2218 08/31/11 0615  BP: 153/69 154/73 184/117 161/85  Pulse: 68 77 64 58  Temp: 98 F (36.7 C) 99.1 F (37.3 C) 97.7 F (36.5 C) 98 F (36.7 C)  TempSrc: Oral Oral Oral Oral  Resp: 19 18 24 20   Height:      Weight:      SpO2: 98% 98% 97% 97%   Weight change:   Intake/Output Summary (Last 24 hours) at 08/31/11 1356 Last data filed at 08/31/11 1314  Gross per 24 hour  Intake    890 ml  Output    800 ml  Net     90 ml    BP 161/85  Pulse 58  Temp(Src) 98 F (36.7 C) (Oral)  Resp 20  Ht 5\' 6"  (1.676 m)  Wt 95 kg (209 lb 7 oz)  BMI 33.80 kg/m2  SpO2 97%  General Appearance:    Alert, completely confused , unable to assess  Head:    Normocephalic, without obvious abnormality, atraumatic  Eyes:    PERRL, conjunctiva/corneas clear, EOM's intact, fundi    benign, both eyes  Ears:    Normal TM's and external ear canals, both ears  Nose:   Nares normal, septum midline, mucosa normal, no drainage    or sinus tenderness  Throat:   Lips, mucosa, and tongue normal; teeth and gums normal  Neck:   Supple, symmetrical, trachea midline, no adenopathy;    thyroid:  no enlargement/tenderness/nodules; no carotid   bruit or JVD  Back:     Symmetric, no curvature, ROM normal, no CVA tenderness  Lungs:     Clear to auscultation bilaterally, respirations unlabored  Chest Wall:    No tenderness or deformity   Heart:    Regular rate and rhythm, S1 and S2 normal, no murmur, rub   or gallop  Breast Exam:    No tenderness, masses, or nipple abnormality  Abdomen:     Soft, non-tender, bowel sounds active all four quadrants,    no masses, no organomegaly  Genitalia:    Normal female without lesion, discharge or tenderness  Rectal:    Normal tone, normal prostate, no masses or tenderness;   guaiac negative stool  Extremities:    Extremities normal, atraumatic, no cyanosis or edema  Pulses:   2+ and symmetric all extremities  Skin:   Skin color, texture, turgor normal, no rashes or lesions  Lymph nodes:   Cervical, supraclavicular, and axillary nodes normal  Neurologic:   CNII-XII intact, normal strength, sensation and reflexes    throughout    Lab Results:  Basename 08/31/11 0505 08/30/11 0530  NA 141 139  K 3.1* 3.0*  CL 103 102  CO2 30 28  GLUCOSE 106* 103*  BUN 10 10  CREATININE 0.60 0.61  CALCIUM 9.4 9.4  MG -- --  PHOS -- --       Studies/Results: Ct Head Wo Contrast  08/27/2011  *RADIOLOGY REPORT*  Clinical Data: Status post fall.  History of dementia.  CT HEAD WITHOUT CONTRAST  Technique:  Contiguous axial images were obtained from the base of the skull through the vertex without contrast.  Comparison: Head CT and MRI 10/10/2010.  Findings: There is stable moderate generalized ventriculomegaly with diffuse prominence of the subarachnoid spaces consistent with moderate atrophy.  No acute intracranial hemorrhage, mass lesion, brain edema or  extra-axial fluid collection is identified.  There is no evidence of acute stroke.  Mild chronic small vessel ischemic changes are stable.  The visualized paranasal sinuses are clear and the calvarium is intact.  IMPRESSION: Stable atrophy and chronic small vessel ischemic changes.  No acute intracranial findings.  Original Report Authenticated By: Gerrianne Scale, M.D.    Medications:  I have reviewed the patient's current medications. Anti-infectives    None     Scheduled Meds:   . aspirin EC  81 mg Oral Daily  . chlorthalidone  25 mg Oral Daily  . cholecalciferol  1,000 Units Oral Daily  . divalproex  125 mg Oral BID  . donepezil  10 mg Oral QHS  . enoxaparin  40 mg Subcutaneous Q24H  . ferrous sulfate  325 mg Oral BID WC  . hydrALAZINE  10 mg Oral Q8H  . lisinopril  20 mg Oral Daily  . polyethylene glycol  17 g Oral Daily  . sertraline  50 mg  Oral Daily  . simvastatin  5 mg Oral QHS   Continuous Infusions:   . sodium chloride 50 mL/hr at 08/30/11 1851   PRN Meds:.acetaminophen, ondansetron (ZOFRAN) IV, ondansetron, senna, zolpidem  Assessment/Plan: Principal Problem:  *recurrent Syncope: has similar episode in the past felt could be related to orthostasis. Will check it. Tele monitor without significant bradycardia. Hypokalemia replacement  Dementia at baseline per family to transfer to dementia unit  HTN (hypertension): hydralazine added , need to check orthostatis  Diabetes mellitus: no hypoglycemic agents  Seizure disorder: on depakote  Microcytic anemia: stable  Hypokalemia: replacement  disposition: SNF in am     LOS: 4 days  Sheryl Jones I. 08/31/2011, 1:56 PM

## 2011-09-01 LAB — BASIC METABOLIC PANEL
CO2: 29 mEq/L (ref 19–32)
Calcium: 9.3 mg/dL (ref 8.4–10.5)
Chloride: 102 mEq/L (ref 96–112)
Creatinine, Ser: 0.65 mg/dL (ref 0.50–1.10)
Glucose, Bld: 104 mg/dL — ABNORMAL HIGH (ref 70–99)

## 2011-09-01 LAB — CBC
HCT: 34 % — ABNORMAL LOW (ref 36.0–46.0)
MCH: 23.1 pg — ABNORMAL LOW (ref 26.0–34.0)
MCV: 73.3 fL — ABNORMAL LOW (ref 78.0–100.0)
Platelets: 379 10*3/uL (ref 150–400)
RBC: 4.64 MIL/uL (ref 3.87–5.11)
RDW: 17.1 % — ABNORMAL HIGH (ref 11.5–15.5)

## 2011-09-01 MED ORDER — HYDRALAZINE HCL 10 MG PO TABS: 10.0000 mg | ORAL_TABLET | Freq: Three times a day (TID) | ORAL | Status: DC

## 2011-09-01 MED ORDER — HYDRALAZINE HCL 10 MG PO TABS: 10.0000 mg | ORAL_TABLET | Freq: Two times a day (BID) | ORAL | Status: DC

## 2011-09-01 MED ORDER — CHLORTHALIDONE 25 MG PO TABS: 25.0000 mg | ORAL_TABLET | Freq: Every day | ORAL | Status: DC

## 2011-09-01 NOTE — Progress Notes (Signed)
Patient had a TB skin test on Wednesday. TB test is negative on the RAFA.

## 2011-09-01 NOTE — Plan of Care (Signed)
Problem: Phase II Progression Outcomes Goal: IV changed to normal saline lock Outcome: Not Applicable Date Met:  09/01/11 Pt does not have an IV access, confused, pulled IV out

## 2011-09-01 NOTE — Discharge Summary (Signed)
Physician Discharge Summary  Patient ID: Sheryl Jones MRN: [id number] DOB/AGE: [birth year] 77 y.o.  Admit date: 08/27/2011 Discharge date: 09/01/2011    Discharge Diagnoses:  1- recurrent syncope : causes not clear , could be medications . Hypoglycemia could be cause also no full documentation, arrythmia role out with tele monitor negative, has EEG done on previous admission which was not nonocclusive of any epileptiform discharge. May need Holter monitor. 2-uncontroll hypertension 3- AMS improved 4-dementia 5- deconditioning 6- history of seizure   Discharged Condition: stable  Hospital Course: 77 year old female with history of HTN, dementia (from [facility]), Diabetes, recurrent falls who was brought from ALF after syncopal episode. As per patient's daughter patient has lost consciousness for 1-2 minutes while trying to walk to the bathroom. She wasn't confused after regaining consciousness, there was no seizure like activities, no tongue biting but patient does have urinary incontinence unrelated to seizure disorder. Patient had no complaints of blurry vision or lightheadedness prior to passing out. As per daughter this is a fourth time she passed out in last few months but no recommendation was made for higher level of care such as skilled nursing facility.  Patient has no complaints of abdominal pain or nausea or vomiting. No complaints of chest pain or palpitations or shortness of breath, no complaints of cough.  Patient admitted to medicine service for further evaluation and management. Patient kept on tele with no events, cardiac enzymes negative, ativan and metformin was stopped ,and patient mentation improved, orthostatics not check secondary to patient limited mobility, echo within normal, blood sugar within normal , accordingly metformin was dced, patient may need Holter monitor if recuer.  Consults: none  Significant Diagnostic Studies: echoLeft ventricle: The cavity size was  normal. There was mild concentric hypertrophy. Systolic function was normal. The estimated ejection fraction was in the range of 50% to 55%. Wall motion was normal; there were no regional wall motion abnormalities. Doppler parameters are consistent with abnormal left ventricular relaxation (grade 1 diastolic dysfunction). T Left ventricle: The cavity size was normal. There was mild concentric hypertrophy. Systolic function was normal. The estimated ejection fraction was in the range of 50% to 55%. Wall motion was normal; there were no regional wall motion abnormalities. Doppler parameters are consistent with abnormal left ventricular relaxation (grade 1 diastolic dysfunction). Discharge Exam: Blood pressure 139/76, pulse 63, temperature 98.8 F (37.1 C), temperature source Oral, resp. rate 18, height 5\' 6"  (1.676 m), weight 95 kg (209 lb 7 oz), SpO2 97.00%. General appearance: alert and no distress,not fully following command , oriented +1 Head: Normocephalic, without obvious abnormality, atraumatic Nose: Nares normal. Septum midline. Mucosa normal. No drainage or sinus tenderness. Neck: no adenopathy, no carotid bruit, no JVD, supple, symmetrical, trachea midline and thyroid not enlarged, symmetric, no tenderness/mass/nodules Resp: clear to auscultation bilaterally and normal percussion bilaterally Breasts: normal appearance, no masses or tenderness Cardio: regular rate and rhythm, S1, S2 normal, no murmur, click, rub or gallop GI: soft, non-tender; bowel sounds normal; no masses,  no organomegaly Extremities: extremities normal, atraumatic, no cyanosis or edema Neurologic: Mental status: Alert, oriented, thought content appropriate Cranial nerves: normal Sensory: normal  Disposition:   Discharge Orders    Future Orders Please Complete By Expires   Diet - low sodium heart healthy      Diet Carb Modified      Increase activity slowly      Call MD for:      Scheduling  Instructions:   Any other  syncopal events     Current Discharge Medication List    START taking these medications   Details       hydrALAZINE (APRESOLINE) 10 MG tablet Take 1 tablet (10 mg total) by mouthBID. Qty: 30 tablet, Refills: 1      CONTINUE these medications which have NOT CHANGED   Details  acetaminophen (TYLENOL) 325 MG tablet Take 650 mg by mouth every 4 (four) hours as needed. For pain.     aspirin EC 81 MG tablet Take 81 mg by mouth daily.      cholecalciferol (VITAMIN D) 1000 UNITS tablet Take 1,000 Units by mouth daily.      divalproex (DEPAKOTE) 125 MG DR tablet Take 125 mg by mouth 2 (two) times daily.      donepezil (ARICEPT) 10 MG tablet Take 10 mg by mouth at bedtime.     ferrous sulfate 325 (65 FE) MG tablet Take 325 mg by mouth 2 (two) times daily with a meal.     lisinopril (PRINIVIL,ZESTRIL) 20 MG tablet Take 20 mg by mouth daily.      sertraline (ZOLOFT) 50 MG tablet Take 50 mg by mouth daily.      simvastatin (ZOCOR) 5 MG tablet Take 5 mg by mouth at bedtime.        STOP taking these medications     divalproex (DEPAKOTE SPRINKLE) 125 MG capsule      LORazepam (ATIVAN) 0.5 MG tablet      metFORMIN (GLUCOPHAGE) 1000 MG tablet      metFORMIN (GLUCOPHAGE) 850 MG tablet          Signed: Laura Radilla I. 09/01/2011, 1:51 PM

## 2012-06-09 ENCOUNTER — Encounter (HOSPITAL_COMMUNITY): Payer: Self-pay | Admitting: Emergency Medicine

## 2012-06-09 ENCOUNTER — Emergency Department (HOSPITAL_COMMUNITY)
Admission: EM | Admit: 2012-06-09 | Discharge: 2012-06-09 | Disposition: A | Discharge status: Home / Self Care | Payer: Medicare Other | Attending: Emergency Medicine | Admitting: Emergency Medicine

## 2012-06-09 DIAGNOSIS — Z79899 Other long term (current) drug therapy: Secondary | ICD-10-CM | POA: Insufficient documentation

## 2012-06-09 DIAGNOSIS — F068 Other specified mental disorders due to known physiological condition: Secondary | ICD-10-CM | POA: Insufficient documentation

## 2012-06-09 DIAGNOSIS — I10 Essential (primary) hypertension: Secondary | ICD-10-CM | POA: Insufficient documentation

## 2012-06-09 DIAGNOSIS — E119 Type 2 diabetes mellitus without complications: Secondary | ICD-10-CM | POA: Insufficient documentation

## 2012-06-09 DIAGNOSIS — R55 Syncope and collapse: Secondary | ICD-10-CM | POA: Insufficient documentation

## 2012-06-09 LAB — CBC WITH DIFFERENTIAL/PLATELET
Basophils Absolute: 0.1 10*3/uL (ref 0.0–0.1)
Basophils Relative: 1 % (ref 0–1)
Eosinophils Absolute: 0.1 10*3/uL (ref 0.0–0.7)
Eosinophils Relative: 2 % (ref 0–5)
HCT: 32 % — ABNORMAL LOW (ref 36.0–46.0)
Hemoglobin: 10.1 g/dL — ABNORMAL LOW (ref 12.0–15.0)
MCH: 23.2 pg — ABNORMAL LOW (ref 26.0–34.0)
MCHC: 31.6 g/dL (ref 30.0–36.0)
MCV: 73.4 fL — ABNORMAL LOW (ref 78.0–100.0)
Monocytes Absolute: 0.5 10*3/uL (ref 0.1–1.0)
Monocytes Relative: 8 % (ref 3–12)
Neutro Abs: 3.6 10*3/uL (ref 1.7–7.7)
RDW: 16.4 % — ABNORMAL HIGH (ref 11.5–15.5)

## 2012-06-09 LAB — BASIC METABOLIC PANEL
BUN: 17 mg/dL (ref 6–23)
Chloride: 104 mEq/L (ref 96–112)
Creatinine, Ser: 0.64 mg/dL (ref 0.50–1.10)
GFR calc Af Amer: >90 mL/min (ref >90)
Glucose, Bld: 99 mg/dL (ref 70–99)
Potassium: 3.5 mEq/L (ref 3.5–5.1)

## 2012-06-09 LAB — URINALYSIS, ROUTINE W REFLEX MICROSCOPIC
Bilirubin Urine: NEGATIVE
Hgb urine dipstick: NEGATIVE
Ketones, ur: NEGATIVE mg/dL
Nitrite: NEGATIVE
pH: 6.5 (ref 5.0–8.0)

## 2012-06-09 NOTE — ED Notes (Signed)
[facility] notified to transport pt back to [facility] facility.

## 2012-06-09 NOTE — ED Notes (Signed)
[id number]<AV> Expected date:06/09/12<BR> Expected time:11:36 AM<BR> Means of arrival:Ambulance<BR> Comments:<BR> Syncopal episode

## 2012-06-09 NOTE — ED Notes (Signed)
PER EMS- pt picked up from [facility] with c/o syncope.  Pt was  Being helped up by staff when she just fainted.  Reports being LOC for a couple of minutes. Pt has hx of syncope, HTN, DM, Dementia.  No fall or head injury.

## 2012-06-09 NOTE — ED Notes (Signed)
Per telephone call with daughter, pt's baseline is nonverbal, but alert daughter states "she usually smiles when we are talking to her"  Daughter en route.

## 2012-06-09 NOTE — ED Provider Notes (Signed)
History     CSN: [id number]  Arrival date & time 06/09/12  1136   First MD Initiated Contact with Patient 06/09/12 1150      Chief Complaint  Patient presents with  . Syncope     (Consider location/radiation/quality/duration/timing/severity/associated sxs/prior treatment) HPI This is a 78 year old black female with a history of severe dementia. She was being helped up off the toilet this morning when she had a syncopal episode. Staff reports she was only out for a couple of minutes. She did not fall and there was no injury. Patient is returned to her baseline.  Past Medical History  Diagnosis Date  . Dementia   . Hypertension   . Diabetes mellitus     History reviewed. No pertinent past surgical history.  No family history on file.  History  Substance Use Topics  . Smoking status: Not on file  . Smokeless tobacco: Not on file  . Alcohol Use: No    OB History    Grav Para Term Preterm Abortions TAB SAB Ect Mult Living                  Review of Systems  Unable to perform ROS   Allergies  Review of patient's allergies indicates no known allergies.  Home Medications   Current Outpatient Rx  Name Route Sig Dispense Refill  . ACETAMINOPHEN 325 MG PO TABS Oral Take 650 mg by mouth every 4 (four) hours as needed. For pain.     . ASPIRIN EC 81 MG PO TBEC Oral Take 81 mg by mouth daily.      Marland Kitchen VITAMIN D 1000 UNITS PO TABS Oral Take 1,000 Units by mouth daily.      Marland Kitchen DIVALPROEX SODIUM 125 MG PO TBEC Oral Take 125 mg by mouth 2 (two) times daily.      . DONEPEZIL HCL 10 MG PO TABS Oral Take 10 mg by mouth at bedtime.     Marland Kitchen FERROUS SULFATE 325 (65 FE) MG PO TABS Oral Take 325 mg by mouth 2 (two) times daily with a meal.     . HYDRALAZINE HCL 10 MG PO TABS Oral Take 1 tablet (10 mg total) by mouth 2 (two) times daily. 30 tablet 1  . LISINOPRIL 20 MG PO TABS Oral Take 20 mg by mouth daily.      . SERTRALINE HCL 50 MG PO TABS Oral Take 50 mg by mouth daily.      Marland Kitchen  SIMVASTATIN 5 MG PO TABS Oral Take 5 mg by mouth at bedtime.        Temp 97.7 F (36.5 C) (Axillary)  Resp 20  SpO2 97%  Physical Exam General: Well-developed, well-nourished female in no acute distress; appearance consistent with age of record HENT: normocephalic, atraumatic; poor dentition Eyes: pupils equal round and reactive to light Neck: supple Heart: regular rate and rhythm; distant cell Lungs: clear to auscultation bilaterally Abdomen: soft; nondistended; nontender; bowel sounds present Extremities: Nontender chronic-appearing deformity of left ankle; normal range of motion Neurologic: Opens eyes to voice; speech incomprehensible; noted to move all extremities; no facial droop Skin: Warm and dry    ED Course  Procedures (including critical care time)     MDM   Nursing notes and vitals signs, including pulse oximetry, reviewed.  Summary of this visit's results, reviewed by myself:  Labs:  Results for orders placed during the hospital encounter of 06/09/12  URINALYSIS, ROUTINE W REFLEX MICROSCOPIC      Component Value  Range   Color, Urine YELLOW  YELLOW   APPearance CLEAR  CLEAR   Specific Gravity, Urine 1.023  1.005 - 1.030   pH 6.5  5.0 - 8.0   Glucose, UA NEGATIVE  NEGATIVE mg/dL   Hgb urine dipstick NEGATIVE  NEGATIVE   Bilirubin Urine NEGATIVE  NEGATIVE   Ketones, ur NEGATIVE  NEGATIVE mg/dL   Protein, ur NEGATIVE  NEGATIVE mg/dL   Urobilinogen, UA 1.0  0.0 - 1.0 mg/dL   Nitrite NEGATIVE  NEGATIVE   Leukocytes, UA NEGATIVE  NEGATIVE  BASIC METABOLIC PANEL      Component Value Range   Sodium 142  135 - 145 mEq/L   Potassium 3.5  3.5 - 5.1 mEq/L   Chloride 104  96 - 112 mEq/L   CO2 29  19 - 32 mEq/L   Glucose, Bld 99  70 - 99 mg/dL   BUN 17  6 - 23 mg/dL   Creatinine, Ser 6.57  0.50 - 1.10 mg/dL   Calcium 9.0  8.4 - 84.6 mg/dL   GFR calc non Af Amer 85 (*) >90 mL/min   GFR calc Af Amer >90  >90 mL/min  CBC WITH DIFFERENTIAL      Component Value  Range   WBC 6.8  4.0 - 10.5 K/uL   RBC 4.36  3.87 - 5.11 MIL/uL   Hemoglobin 10.1 (*) 12.0 - 15.0 g/dL   HCT 96.2 (*) 95.2 - 84.1 %   MCV 73.4 (*) 78.0 - 100.0 fL   MCH 23.2 (*) 26.0 - 34.0 pg   MCHC 31.6  30.0 - 36.0 g/dL   RDW 32.4 (*) 40.1 - 02.7 %   Platelets 417 (*) 150 - 400 K/uL   Neutrophils Relative 53  43 - 77 %   Neutro Abs 3.6  1.7 - 7.7 K/uL   Lymphocytes Relative 37  12 - 46 %   Lymphs Abs 2.5  0.7 - 4.0 K/uL   Monocytes Relative 8  3 - 12 %   Monocytes Absolute 0.5  0.1 - 1.0 K/uL   Eosinophils Relative 2  0 - 5 %   Eosinophils Absolute 0.1  0.0 - 0.7 K/uL   Basophils Relative 1  0 - 1 %   Basophils Absolute 0.1  0.0 - 0.1 K/uL  TROPONIN I      Component Value Range   Troponin I <0.30  <0.30 ng/mL      EKG Interpretation:  Date & Time: 06/09/2012 11:78M  Rate: 62Rhythm: normal sinus rhythm  QRS Axis: normal  Intervals: normal  ST/T Wave abnormalities: nonspecific T wave changes  Conduction Disutrbances:none  Narrative Interpretation: [facility]  Old EKG Reviewed: unchanged  1:09 PM Patient at her baseline per her daughters. Daughters were advised of laboratory findings. This is likely syncope due to use of the toilet.         Hanley Seamen, MD 06/09/12 1309

## 2012-11-14 ENCOUNTER — Encounter: Payer: Self-pay | Admitting: Internal Medicine

## 2012-12-16 ENCOUNTER — Ambulatory Visit: Payer: Medicare Other | Admitting: Internal Medicine

## 2012-12-31 ENCOUNTER — Encounter: Payer: Self-pay | Admitting: Internal Medicine

## 2012-12-31 ENCOUNTER — Ambulatory Visit (INDEPENDENT_AMBULATORY_CARE_PROVIDER_SITE_OTHER): Payer: Medicare Other | Admitting: Internal Medicine

## 2012-12-31 VITALS — BP 170/88 | HR 56 | Ht 66.0 in | Wt 219.0 lb

## 2012-12-31 DIAGNOSIS — G3183 Dementia with Lewy bodies: Secondary | ICD-10-CM

## 2012-12-31 DIAGNOSIS — D509 Iron deficiency anemia, unspecified: Secondary | ICD-10-CM

## 2012-12-31 NOTE — Patient Instructions (Addendum)
It has been recommended to you by your physician that you have a(n) colonoscopy completed. Per your request, we did not schedule the procedure(s) today. Please contact our office at [phone] should you decide to have the procedure completed. 

## 2012-12-31 NOTE — Progress Notes (Signed)
HISTORY OF PRESENT ILLNESS:  Sheryl Jones is a 79 y.o. female who is sent today regarding chronic microcytic anemia. She has multiple medical problems including severe advanced dementia with limited activity and interaction. She will lungs to a long-term skilled nursing facility. She is sent here by one of her supervising physicians regarding "iron deficiency anemia. New onset significant problem". Review of the electronic medical record reveals ascitic anemia dating back to 2011. At that time, normal ferritin of 26. Over the past year she has had chronic stable microcytic  With hemoglobin in the 10 range and MCV in the 70s. No Hemoccult testing found. Patient was placed on iron. She is accompanied today by a nursing home assistant as well as her daughter Sheryl Jones. The patient cannot provide any history and is in a wheelchair. Her daughter reports intermittent episodes of nonbloody emesis as the only positive GI review of system. I told the patient had colonoscopy with polyps found in the 1990s when she lived in [place]. No GI problems or GI evaluations since she moved here about 9 years ago. Outside medications reviewed. She is on aspirin but no other NSAIDs. No PPI.   REVIEW OF SYSTEMS:  All non-GI ROS negative except for dementia   Past Medical History  Diagnosis Date  . Dementia   . Hypertension   . Diabetes mellitus   . Iron deficiency anemia   . Hypokalemia   . Depressive disorder   . Anxiety   . Unspecified epilepsy without mention of intractable epilepsy   . Other and unspecified hyperlipidemia   . Colon polyp     History reviewed. No pertinent past surgical history.  Social History Sheryl Jones  reports that she has quit smoking. Her smoking use included Cigarettes. She smoked 0.00 packs per day. She has never used smokeless tobacco. She reports that she does not drink alcohol or use illicit drugs.  family history includes Breast cancer in her sister; Heart attack in her father and  mother; and Hypertension in her daughter and father.  No Known Allergies     PHYSICAL EXAMINATION: Vital signs: BP 170/88  Pulse 56  Ht 5\' 6"  (1.676 m)  Wt 219 lb (99.338 kg)  BMI 35.36 kg/m2  Constitutional:  wheelchair-bound, noncommunicating, no acute distress Psychiatric: alert  but not oriented , cannot follow commands  Eyes: extraocular movements intact, anicteric, conjunctiva pink Mouth: oral pharynx moist, no lesions Neck: supple no lymphadenopathy Cardiovascular: heart regular rate and rhythm, no murmur Lungs: clear to auscultation bilaterally Abdomen: soft, obese,  nontender, nondistended, no obvious ascites, no peritoneal signs, normal bowel sounds, no organomegaly Rectal: omitted  Extremities:  trace  lower extremity edema bilaterally Skin: no lesions on visible extremities Neuro: No focal deficits. obvious advanced dementia   ASSESSMENT:  #1. Chronic microcytic anemia. Possibly iron deficient #2. Multiple medical problems #3. Advanced dementia    PLAN:  #1. I discussed with the patient's daughter that if she has iron deficiency anemia, it could be due to chronic GI blood loss. I reviewed possible causes such as ulcers, erosions, Cameron erosions, esophagitis, AVMs, and neoplasia. I also discussed that, except for cancer, these can be treated empirically with iron and PPI and avoid the need for endoscopic evaluations. I also reviewed the difficulty in performing colonoscopy and upper endoscopy in a patient with advanced dementia. I also brought out the point that, should cancer be found, would that with the patient through operation? Which she be a candidate for adjuvant therapy? Personally, I  would be in favor of foregoing endoscopic evaluations and treating her empirically with PPI and iron. Patient's daughter will returned to discuss things with her siblings and contact this office regarding her decision. If they decide to proceed, this would need to be done at the  hospital with CRNA supervised propofol for sedation.

## 2013-01-20 ENCOUNTER — Encounter (HOSPITAL_COMMUNITY): Payer: Self-pay | Admitting: Emergency Medicine

## 2013-01-20 ENCOUNTER — Emergency Department (HOSPITAL_COMMUNITY): Payer: Medicare Other

## 2013-01-20 ENCOUNTER — Inpatient Hospital Stay (HOSPITAL_COMMUNITY)
Admission: EM | Admit: 2013-01-20 | Discharge: 2013-01-23 | DRG: 101 | Disposition: A | Discharge status: Skilled Nursing Facility | Payer: Medicare Other | Attending: Internal Medicine | Admitting: Internal Medicine

## 2013-01-20 DIAGNOSIS — Z7982 Long term (current) use of aspirin: Secondary | ICD-10-CM

## 2013-01-20 DIAGNOSIS — I6350 Cerebral infarction due to unspecified occlusion or stenosis of unspecified cerebral artery: Secondary | ICD-10-CM

## 2013-01-20 DIAGNOSIS — R569 Unspecified convulsions: Secondary | ICD-10-CM

## 2013-01-20 DIAGNOSIS — Z993 Dependence on wheelchair: Secondary | ICD-10-CM

## 2013-01-20 DIAGNOSIS — Z8249 Family history of ischemic heart disease and other diseases of the circulatory system: Secondary | ICD-10-CM

## 2013-01-20 DIAGNOSIS — Z8673 Personal history of transient ischemic attack (TIA), and cerebral infarction without residual deficits: Secondary | ICD-10-CM

## 2013-01-20 DIAGNOSIS — F329 Major depressive disorder, single episode, unspecified: Secondary | ICD-10-CM

## 2013-01-20 DIAGNOSIS — F0390 Unspecified dementia without behavioral disturbance: Secondary | ICD-10-CM | POA: Diagnosis present

## 2013-01-20 DIAGNOSIS — E876 Hypokalemia: Secondary | ICD-10-CM

## 2013-01-20 DIAGNOSIS — E119 Type 2 diabetes mellitus without complications: Secondary | ICD-10-CM | POA: Diagnosis present

## 2013-01-20 DIAGNOSIS — K5900 Constipation, unspecified: Secondary | ICD-10-CM | POA: Diagnosis present

## 2013-01-20 DIAGNOSIS — F3289 Other specified depressive episodes: Secondary | ICD-10-CM | POA: Diagnosis present

## 2013-01-20 DIAGNOSIS — R55 Syncope and collapse: Secondary | ICD-10-CM | POA: Diagnosis present

## 2013-01-20 DIAGNOSIS — I6990 Unspecified sequelae of unspecified cerebrovascular disease: Secondary | ICD-10-CM | POA: Diagnosis present

## 2013-01-20 DIAGNOSIS — G8389 Other specified paralytic syndromes: Secondary | ICD-10-CM | POA: Diagnosis present

## 2013-01-20 DIAGNOSIS — G40802 Other epilepsy, not intractable, without status epilepticus: Principal | ICD-10-CM | POA: Diagnosis present

## 2013-01-20 DIAGNOSIS — Z79899 Other long term (current) drug therapy: Secondary | ICD-10-CM

## 2013-01-20 DIAGNOSIS — I10 Essential (primary) hypertension: Secondary | ICD-10-CM | POA: Diagnosis present

## 2013-01-20 DIAGNOSIS — G40909 Epilepsy, unspecified, not intractable, without status epilepticus: Secondary | ICD-10-CM

## 2013-01-20 DIAGNOSIS — I639 Cerebral infarction, unspecified: Secondary | ICD-10-CM

## 2013-01-20 DIAGNOSIS — Z87891 Personal history of nicotine dependence: Secondary | ICD-10-CM

## 2013-01-20 DIAGNOSIS — D509 Iron deficiency anemia, unspecified: Secondary | ICD-10-CM

## 2013-01-20 DIAGNOSIS — F411 Generalized anxiety disorder: Secondary | ICD-10-CM | POA: Diagnosis present

## 2013-01-20 DIAGNOSIS — Z803 Family history of malignant neoplasm of breast: Secondary | ICD-10-CM

## 2013-01-20 DIAGNOSIS — E785 Hyperlipidemia, unspecified: Secondary | ICD-10-CM | POA: Diagnosis present

## 2013-01-20 LAB — CBC
HCT: 28.7 % — ABNORMAL LOW (ref 36.0–46.0)
MCV: 65.5 fL — ABNORMAL LOW (ref 78.0–100.0)
RBC: 4.38 MIL/uL (ref 3.87–5.11)
WBC: 7.9 10*3/uL (ref 4.0–10.5)

## 2013-01-20 LAB — COMPREHENSIVE METABOLIC PANEL
ALT: 10 U/L (ref 0–35)
AST: 21 U/L (ref 0–37)
CO2: 24 mEq/L (ref 19–32)
Chloride: 103 mEq/L (ref 96–112)
Creatinine, Ser: 0.74 mg/dL (ref 0.50–1.10)
GFR calc non Af Amer: 80 mL/min — ABNORMAL LOW (ref >90)
Total Bilirubin: 0.2 mg/dL — ABNORMAL LOW (ref 0.3–1.2)

## 2013-01-20 LAB — POCT I-STAT, CHEM 8
BUN: 22 mg/dL (ref 6–23)
Chloride: 107 mEq/L (ref 96–112)
Sodium: 143 mEq/L (ref 135–145)

## 2013-01-20 LAB — GLUCOSE, CAPILLARY: Glucose-Capillary: 122 mg/dL — ABNORMAL HIGH (ref 70–99)

## 2013-01-20 LAB — CBC WITH DIFFERENTIAL/PLATELET
Blasts: 0 %
Lymphocytes Relative: 53 % — ABNORMAL HIGH (ref 12–46)
Metamyelocytes Relative: 0 %
Monocytes Absolute: 0.6 10*3/uL (ref 0.1–1.0)
Monocytes Relative: 6 % (ref 3–12)
Neutrophils Relative %: 36 % — ABNORMAL LOW (ref 43–77)
Platelets: 469 10*3/uL — ABNORMAL HIGH (ref 150–400)
RDW: 22.2 % — ABNORMAL HIGH (ref 11.5–15.5)
WBC: 9.8 10*3/uL (ref 4.0–10.5)
nRBC: 0 /100 WBC

## 2013-01-20 LAB — POCT I-STAT TROPONIN I: Troponin i, poc: 0 ng/mL (ref 0.00–0.08)

## 2013-01-20 LAB — PROTIME-INR: INR: 0.94 (ref 0.00–1.49)

## 2013-01-20 LAB — CK TOTAL AND CKMB (NOT AT ARMC): CK, MB: 1.6 ng/mL (ref 0.3–4.0)

## 2013-01-20 LAB — CREATININE, SERUM: GFR calc Af Amer: >90 mL/min (ref >90)

## 2013-01-20 LAB — APTT: aPTT: 27 seconds (ref 24–37)

## 2013-01-20 LAB — VALPROIC ACID LEVEL: Valproic Acid Lvl: 19.6 ug/mL — ABNORMAL LOW (ref 50.0–100.0)

## 2013-01-20 MED ORDER — ACETAMINOPHEN 650 MG RE SUPP: 650.0000 mg | RECTAL | Status: DC | PRN

## 2013-01-20 MED ORDER — ASPIRIN 300 MG RE SUPP
300.0000 mg | Freq: Every day | RECTAL | Status: DC
  Administered 2013-01-20 – 2013-01-21 (×2): 300 mg via RECTAL
  Filled 2013-01-20 (×2): qty 1

## 2013-01-20 MED ORDER — ONDANSETRON HCL 4 MG/2ML IJ SOLN: 4.0000 mg | Freq: Three times a day (TID) | INTRAMUSCULAR | Status: DC | PRN

## 2013-01-20 MED ORDER — SODIUM CHLORIDE 0.9 % IV SOLN
INTRAVENOUS | Status: AC
  Administered 2013-01-20: 17:00:00 via INTRAVENOUS

## 2013-01-20 MED ORDER — HEPARIN SODIUM (PORCINE) 5000 UNIT/ML IJ SOLN
5000.0000 [IU] | Freq: Three times a day (TID) | INTRAMUSCULAR | Status: DC
  Administered 2013-01-20 – 2013-01-23 (×8): 5000 [IU] via SUBCUTANEOUS
  Filled 2013-01-20 (×11): qty 1

## 2013-01-20 MED ORDER — INSULIN ASPART 100 UNIT/ML ~~LOC~~ SOLN
0.0000 [IU] | Freq: Three times a day (TID) | SUBCUTANEOUS | Status: DC
  Administered 2013-01-22 – 2013-01-23 (×3): 1 [IU] via SUBCUTANEOUS

## 2013-01-20 MED ORDER — ASPIRIN 325 MG PO TABS
325.0000 mg | ORAL_TABLET | Freq: Every day | ORAL | Status: DC
  Filled 2013-01-20: qty 1

## 2013-01-20 MED ORDER — HALOPERIDOL LACTATE 5 MG/ML IJ SOLN: 5.0000 mg | Freq: Once | INTRAMUSCULAR | Status: DC

## 2013-01-20 MED ORDER — DEXTROSE-NACL 5-0.45 % IV SOLN
INTRAVENOUS | Status: DC
  Administered 2013-01-20: 21:00:00 via INTRAVENOUS

## 2013-01-20 MED ORDER — VALPROATE SODIUM 500 MG/5ML IV SOLN
1.0000 g | Freq: Once | INTRAVENOUS | Status: AC
  Administered 2013-01-20: 1000 mg via INTRAVENOUS
  Filled 2013-01-20: qty 10

## 2013-01-20 MED ORDER — ONDANSETRON HCL 4 MG/2ML IJ SOLN: 4.0000 mg | Freq: Four times a day (QID) | INTRAMUSCULAR | Status: DC | PRN

## 2013-01-20 MED ORDER — ACETAMINOPHEN 325 MG PO TABS: 650.0000 mg | ORAL_TABLET | ORAL | Status: DC | PRN

## 2013-01-20 NOTE — ED Provider Notes (Signed)
History     CSN: [id number]  Arrival date & time 01/20/13  1450   First MD Initiated Contact with Patient 01/20/13 1502      Chief Complaint  Patient presents with  . Code Stroke    (Consider location/radiation/quality/duration/timing/severity/associated sxs/prior treatment) HPI Comments: Patient from nursing home with change in mental status. She was brought in as a code stroke. Patient was assisted to the bathroom and while having a bowel movement her eyes rolled back in her head and she became unresponsive for a few seconds. She questionable loss of pulse and CPR was performed for a few seconds. She had twitching of her lower shin. On arrival there is no seizure activity. Patient had a left gaze and does not follow commands. She is moving all of her extremities spontaneously. She is wheelchair-bound at baseline nonverbal at baseline.  The history is provided by the EMS personnel. The history is limited by the condition of the patient.    Past Medical History  Diagnosis Date  . Dementia   . Hypertension   . Diabetes mellitus   . Iron deficiency anemia   . Hypokalemia   . Depressive disorder   . Anxiety   . Unspecified epilepsy without mention of intractable epilepsy   . Other and unspecified hyperlipidemia   . Colon polyp     History reviewed. No pertinent past surgical history.  Family History  Problem Relation Age of Onset  . Heart attack Mother   . Heart attack Father   . Breast cancer Sister     meta  . Hypertension Father   . Hypertension Daughter     History  Substance Use Topics  . Smoking status: Former Smoker    Types: Cigarettes  . Smokeless tobacco: Never Used  . Alcohol Use: No    OB History   Grav Para Term Preterm Abortions TAB SAB Ect Mult Living                  Review of Systems  Unable to perform ROS: Patient nonverbal    Allergies  Review of patient's allergies indicates no known allergies.  Home Medications   No current  outpatient prescriptions on file.  BP 128/46  Pulse 59  Temp(Src) 97.6 F (36.4 C) (Axillary)  Resp 19  SpO2 100%  Physical Exam  Constitutional: She appears well-developed and well-nourished. No distress.  HENT:  Head: Normocephalic and atraumatic.  Eyes: Conjunctivae and EOM are normal. Pupils are equal, round, and reactive to light.  Left gaze palsy  Neck: Normal range of motion. Neck supple.  Cardiovascular: Normal rate, regular rhythm and normal heart sounds.   No murmur heard. Pulmonary/Chest: Effort normal and breath sounds normal. No respiratory distress.  Abdominal: Soft. There is no tenderness. There is no rebound and no guarding.  Musculoskeletal: Normal range of motion. She exhibits no edema and no tenderness.  Neurological: She is alert. A cranial nerve deficit is present.  Patient is not follow commands. She has good tone in her extremities. She has difficulty holding arms and legs off the bed. Right arm seems somewhat weaker than left.  Skin: Skin is warm.    ED Course  Procedures (including critical care time)  Labs Reviewed  CBC WITH DIFFERENTIAL - Abnormal; Notable for the following:    Hemoglobin 9.1 (*)    HCT 30.0 (*)    MCV 66.1 (*)    MCH 20.0 (*)    RDW 22.2 (*)    Platelets 469 (*)  Neutrophils Relative 36 (*)    Lymphocytes Relative 53 (*)    Basophils Relative 2 (*)    Lymphs Abs 5.2 (*)    Basophils Absolute 0.2 (*)    All other components within normal limits  COMPREHENSIVE METABOLIC PANEL - Abnormal; Notable for the following:    Glucose, Bld 121 (*)    Total Bilirubin 0.2 (*)    GFR calc non Af Amer 80 (*)    All other components within normal limits  VALPROIC ACID LEVEL - Abnormal; Notable for the following:    Valproic Acid Lvl 19.6 (*)    All other components within normal limits  GLUCOSE, CAPILLARY - Abnormal; Notable for the following:    Glucose-Capillary 122 (*)    All other components within normal limits  POCT I-STAT,  CHEM 8 - Abnormal; Notable for the following:    Glucose, Bld 126 (*)    Hemoglobin 11.6 (*)    HCT 34.0 (*)    All other components within normal limits  PROTIME-INR  APTT  CK TOTAL AND CKMB  ETHANOL  TROPONIN I  URINE RAPID DRUG SCREEN (HOSP PERFORMED)  URINALYSIS, ROUTINE W REFLEX MICROSCOPIC  HEMOGLOBIN A1C  LIPID PANEL  CBC  CREATININE, SERUM  TSH  VITAMIN B12  POCT I-STAT TROPONIN I   Ct Head Wo Contrast  01/20/2013  *RADIOLOGY REPORT*  Clinical Data: Leftward gaze.  Left-sided weakness.  Aphasia.  Code stroke.  CT HEAD WITHOUT CONTRAST  Technique:  Contiguous axial images were obtained from the base of the skull through the vertex without contrast.  Comparison: Unenhanced cranial CT 08/27/2011, 10/10/2010, 05/06/2010, 04/07/2010, 11/05/2009.  MRI brain 10/10/2010.  Findings: Moderate to severe cortical and deep atrophy, unchanged. Mild cerebellar atrophy, unchanged.  Mild changes of small vessel disease of the white matter diffusely, unchanged.  No mass lesion. No midline shift.  No acute hemorrhage or hematoma.  No extra-axial fluid collections.  No evidence of acute infarction.  No significant interval change.  No skull fracture or other focal osseous abnormality involving the skull.  Visualized paranasal sinuses, right mastoid air cells, and both middle ear cavities well-aerated.  Fluid in the left mastoid air cells.  Incidental approximate 8 mm calcified meningioma arising from the right temporal bone in the middle cranial fossa, unchanged.  Mild bilateral carotid siphon atherosclerosis.  IMPRESSION:  1.  No acute intracranial abnormality. 2.  Stable moderate to severe generalized atrophy and mild chronic microvascular ischemic changes of the white matter. 3.  Left mastoid effusion. 4.  Incidental right middle cranial fossa 8 mm meningioma, stable.  These results were called by telephone on 01/20/2013 at 1511 hours to Dr. Leroy Kennedy of Neurology, who verbally acknowledged these results.    Original Report Authenticated By: Hulan Saas, M.D.      1. Seizure       MDM  Patient from nursing facility with syncopal episode versus seizure. She was called they code stroke prior to arrival.  Nonverbal at baseline  CT negative for acute pathology. Nonverbal at baseline.  Inconsistent motor exam with varying sides of apparent weakness.  Dr. Leroy Kennedy at bedside on arrival. Neurology favors seizure with todd's paralysis. Check depakote level.  Will obtain MRI.  D/w Family at bedside.   Date: 01/20/2013  Rate: 65  Rhythm: normal sinus rhythm  QRS Axis: normal  Intervals: normal  ST/T Wave abnormalities: normal  Conduction Disutrbances:none  Narrative Interpretation:   Old EKG Reviewed: unchanged  Glynn Octave, MD 01/20/13 2049

## 2013-01-20 NOTE — H&P (Signed)
[facility] History and Physical  Sheryl Jones UJW:[id number] DOB: [birth year] DOA: 01/20/2013  Referring physician: Dr. Manus Gunning PCP: Karlene Einstein, MD   Chief Complaint: syncope, left gaze preference  HPI: Sheryl Jones is a 79 y.o. female pmh as mentioned below, but significant for seizure disorder, HTN, DM and dementia; transferred from SNF due to an episode of responsiveness, left gaze preference and seizure activity. at baseline Sheryl Jones is not able to communicate but today around 2 pm she sat down in the toilet, had a bowel movement, apparently passed out and had involuntary movements of the right arm. Patient brought to ED immediately;  CT brain showed no acute intracranial abnormalities. Initially call code stroke; but neurology favor seizure and cancel code. TRH call to admit for further evaluation and tx.    Review of Systems: Unable to reviewed as patient no communicative.  Past Medical History  Diagnosis Date  . Dementia   . Hypertension   . Diabetes mellitus   . Iron deficiency anemia   . Hypokalemia   . Depressive disorder   . Anxiety   . Unspecified epilepsy without mention of intractable epilepsy   . Other and unspecified hyperlipidemia   . Colon polyp    History reviewed. No pertinent past surgical history. Social History:  reports that she has quit smoking. Her smoking use included Cigarettes. She smoked 0.00 packs per day. She has never used smokeless tobacco. She reports that she does not drink alcohol or use illicit drugs. Lives at [facility]; needs assistance with every ADL's  No Known Allergies  Family History  Problem Relation Age of Onset  . Heart attack Mother   . Heart attack Father   . Breast cancer Sister     meta  . Hypertension Father   . Hypertension Daughter     Prior to Admission medications   Medication Sig Start Date End Date Taking? Authorizing Provider  acetaminophen (TYLENOL) 500 MG tablet Take 1,000 mg by mouth 3  (three) times daily.   Yes Historical Provider, MD  aspirin 81 MG chewable tablet Chew 81 mg by mouth daily.   Yes Historical Provider, MD  cholecalciferol (VITAMIN D) 1000 UNITS tablet Take 1,000 Units by mouth daily.   Yes Historical Provider, MD  divalproex (DEPAKOTE SPRINKLE) 125 MG capsule Take 125 mg by mouth 2 (two) times daily.   Yes Historical Provider, MD  donepezil (ARICEPT ODT) 10 MG disintegrating tablet Take 10 mg by mouth at bedtime.   Yes Historical Provider, MD  ferrous sulfate 220 (44 FE) MG/5ML solution Take 330 mg by mouth 2 (two) times daily.   Yes Historical Provider, MD  hydrALAZINE (APRESOLINE) 10 MG tablet Take 15 mg by mouth 4 (four) times daily.   Yes Historical Provider, MD  lisinopril (PRINIVIL,ZESTRIL) 20 MG tablet Take 20 mg by mouth daily.     Yes Historical Provider, MD  metFORMIN (GLUCOPHAGE) 500 MG tablet Take 500 mg by mouth 2 (two) times daily with a meal.   Yes Historical Provider, MD  sertraline (ZOLOFT) 50 MG tablet Take 50 mg by mouth daily.     Yes Historical Provider, MD   Physical Exam: Filed Vitals:   01/20/13 1542  BP: 160/104  Pulse: 61  Temp: 97.6 F (36.4 C)  TempSrc: Axillary  SpO2: 100%    General: move limbs spontaneously, no communicative, mouth twitched to left. HENT:  Head: Normocephalic and atraumatic.  Eyes: PERRL, Left gaze palsy, unable to follow fully commands to assess EOM Neck:  Normal range of motion. Neck supple.  Cardiovascular: tachycardic, no rubs or gallops; No murmur heard.  Pulmonary/Chest: CTA bilaterally Abdominal: Soft. There is no tenderness. There is no rebound and no guarding.  Musculoskeletal: trace edema bilaterally, move limbs spontaneously, no joint swelling Neurological: Left face droop. Tongue midline.  Motor: quite inconsistent, but at times moves the left greater than the right side. Sensory: withdraws to pain. DTR's: 1 + all over. Plantars: down bilaterally. Coordination and gait: unable to  test.   Labs on Admission:  Basic Metabolic Panel:  Recent Labs Lab 01/20/13 1459 01/20/13 1507  NA 143 143  K 4.3 4.1  CL 103 107  CO2 24  --   GLUCOSE 121* 126*  BUN 20 22  CREATININE 0.74 0.80  CALCIUM 9.4  --    Liver Function Tests:  Recent Labs Lab 01/20/13 1459  AST 21  ALT 10  ALKPHOS 90  BILITOT 0.2*  PROT 7.0  ALBUMIN 3.6   CBC:  Recent Labs Lab 01/20/13 1459 01/20/13 1507  WBC 9.8  --   NEUTROABS 3.5  --   HGB 9.1* 11.6*  HCT 30.0* 34.0*  MCV 66.1*  --   PLT 469*  --    Cardiac Enzymes:  Recent Labs Lab 01/20/13 1459  CKTOTAL 43  CKMB 1.6  TROPONINI <0.30    CBG:  Recent Labs Lab 01/20/13 1525  GLUCAP 122*    Radiological Exams on Admission: Ct Head Wo Contrast  01/20/2013  *RADIOLOGY REPORT*  Clinical Data: Leftward gaze.  Left-sided weakness.  Aphasia.  Code stroke.  CT HEAD WITHOUT CONTRAST  Technique:  Contiguous axial images were obtained from the base of the skull through the vertex without contrast.  Comparison: Unenhanced cranial CT 08/27/2011, 10/10/2010, 05/06/2010, 04/07/2010, 11/05/2009.  MRI brain 10/10/2010.  Findings: Moderate to severe cortical and deep atrophy, unchanged. Mild cerebellar atrophy, unchanged.  Mild changes of small vessel disease of the white matter diffusely, unchanged.  No mass lesion. No midline shift.  No acute hemorrhage or hematoma.  No extra-axial fluid collections.  No evidence of acute infarction.  No significant interval change.  No skull fracture or other focal osseous abnormality involving the skull.  Visualized paranasal sinuses, right mastoid air cells, and both middle ear cavities well-aerated.  Fluid in the left mastoid air cells.  Incidental approximate 8 mm calcified meningioma arising from the right temporal bone in the middle cranial fossa, unchanged.  Mild bilateral carotid siphon atherosclerosis.  IMPRESSION:  1.  No acute intracranial abnormality. 2.  Stable moderate to severe generalized  atrophy and mild chronic microvascular ischemic changes of the white matter. 3.  Left mastoid effusion. 4.  Incidental right middle cranial fossa 8 mm meningioma, stable.  These results were called by telephone on 01/20/2013 at 1511 hours to Dr. Leroy Kennedy of Neurology, who verbally acknowledged these results.   Original Report Authenticated By: Hulan Saas, M.D.     EKG:  Rate: 65  Rhythm: normal sinus rhythm  QRS Axis: normal  Intervals: normal  ST/T Wave abnormalities: normal  Conduction Disutrbances:none  Narrative Interpretation:  Old EKG Reviewed: unchanged  Assessment/Plan 1-Syncope, slurred speech and muscle twitching:with risk factors differential diagnosis includes orthostatic vs CVA, vs seizure with Todd's paralysis vs worsening dementia. -check EEG -check MRI -IVF's -2-D echo and carotid dopplers -ASA -iv depakote -neurology consult; will follow rec's. -PT -prognosis is guarded and family will discuss advance life directive for goals of care.  2-Dementia: severe and at baseline non able to  ambulate much or fully communicate. -continue supportive care  3-HTN (hypertension): stable. Will use PRN hydralazine while NPO.  4-Diabetes mellitus: will check A1C; SSI  5-Seizure disorder: check EEG and continue anticonvulsants  6-Depression: will continue zoloft as soon as able to tolerate PO.  7-CVA (cerebral infarction): concerns for stroke as daughter expressed changes in her limited speech and also facial droop, 2 days prior to admission.  DVT: heparin.    Neurology  Code Status: Full Family Communication: daughter at bedside Disposition Plan: Admit to the hospital r/o CVA vs seizure as cause for syncope. LOs > 2 midnights; inpatient.  Time spent: >30 minutes  Sharrie Self [facility] Pager [phone]  If 7PM-7AM, please contact night-coverage [email or web address] Password [facility] 01/20/2013, 6:10 PM

## 2013-01-20 NOTE — Code Documentation (Signed)
79 year old brought to [facility] ED as Code Stroke.  Called in field at 1430 - ETA 30 mins.  Per EMS staff reports patient LSW at 1400 when she walked from the lunch room to the bathroom -while on toilet and went out on left side.  EMS reports patient was flacid on left side - global aphasia with forced gaze to the left.  On arrival to ED at 1451 patient had forced gaze to the left - global aphasia - weak on left side - examined at bridge by EDP - airway cleared and to CT scan at  1457.  Patient was incontinent of stool.  BP 165/85 CBG 229.  Exam in room was difficult due to decreased LOC.  I spoke with RN at [facility] who reports that patient walked from the dining room to the bathroom - the nursing tech was with patient - while on the toilet she rolled her eyes to back of head - and went out - was lowered to the floor - they reported loss of pulse - report doing a few chest compressions and the patient became more alert - also during this time was reported to have involuntary movement right arm. They also state patient has severe dementia with baseline speech only occasion will say yes and no.  Patient has history of syncope and seizure.   Update given to Dr. Cyril Mourning and Dr. Manus Gunning.  Initial NIHSS was 23 - decreased to 18. Exam was inconsistent - sometimes moving right side - sometimes left.   Patient now with eyes to midline -opens eyes to name - maex4 purposeful and strong - says yes to some questions - and states her name -  daughter present and  states she looks like she is at baseline except for "face drawn" - some right facial weakness remains.  TO ED at 1451 - stroke team arrival at 1451 - LSW 1400 - to CT at 17 - code stroke cancelled at 92.  Handoff to ED RN.

## 2013-01-20 NOTE — ED Notes (Signed)
Pt walked to bathroom, when she sat down she appeared to pass out and have involuntary movements of the right arm. It was reported that they were unable to find a pulse or BP. CPR was started and pt shortly came around.

## 2013-01-20 NOTE — Consult Note (Signed)
NEURO HOSPITALIST CONSULT NOTE    Reason for Consult:unresponsiveness, left gaze preference. Code stroke.  HPI:                                                                                                                                          Sheryl Jones is an 79 y.o. female with a past medical history significant for hypertension, DM, advanced dementia, seizures on Depakote, brought to [facility] by medics due to altered mental status and left gaze preference. As per nursing home staff, at baseline Sheryl Jones is not able to communicate but today around 2 pm she sat down in the toilet, had a bowel movement,  apparently passed out and had involuntary movements of the right arm. It was reported that they were unable to find a pulse or BP, CPR was started and shortly after she became more responsive. Brought to the ED as a code stroke. Upon arrival to the ED she was able to follow some simple commands but overall was lethargic, had a left gaze preference, and seemed to have decreased movements right upper extremity. CT brain showed no acute intracranial abnormalities.  Past Medical History  Diagnosis Date  . Dementia   . Hypertension   . Diabetes mellitus   . Iron deficiency anemia   . Hypokalemia   . Depressive disorder   . Anxiety   . Unspecified epilepsy without mention of intractable epilepsy   . Other and unspecified hyperlipidemia   . Colon polyp     History reviewed. No pertinent past surgical history.  Family History  Problem Relation Age of Onset  . Heart attack Mother   . Heart attack Father   . Breast cancer Sister     meta  . Hypertension Father   . Hypertension Daughter     Social History:  reports that she has quit smoking. Her smoking use included Cigarettes. She smoked 0.00 packs per day. She has never used smokeless tobacco. She reports that she does not drink alcohol or use illicit drugs.  No Known Allergies  MEDICATIONS:                                                                                                                      I have reviewed the patient's  current medications.   ROS:                                                                                                                                       History obtained from unobtainable from patient due to mental status    Physical exam: lethargic. Head: normocephalic. Neck: supple, no bruits, no JVD. Cardiac: no murmurs. Lungs: clear. Abdomen: soft, no tender, no mass. Extremities: no edema.  Blood pressure 160/104, pulse 61, temperature 97.6 F (36.4 C), temperature source Axillary, SpO2 100.00%.   Neurologic Examination:                                                                                                      Mental status: she is alert and awake but follows only simple commands in an inconsistent way. Doesn't speak but at baseline she is non communicative. CN 2-12: pupils 2 mm bilaterally, reactive to light. Left gaze preference. No nystagmus. Appears to have a right visual field cut. Right face droop. Tongue midline. Motor: quite inconsistent, but at times moves the left greater than the right side. Sensory: withdraws to pain. DTR's: 1 + all over. Plantars: down bilaterally. Coordination and gait: unable to test.    Lab Results  Component Value Date/Time   CHOL 201* 08/27/2011  5:01 PM    Results for orders placed during the hospital encounter of 01/20/13 (from the past 48 hour(s))  PROTIME-INR     Status: None   Collection Time    01/20/13  2:59 PM      Result Value Range   Prothrombin Time 12.5  11.6 - 15.2 seconds   INR 0.94  0.00 - 1.49  APTT     Status: None   Collection Time    01/20/13  2:59 PM      Result Value Range   aPTT 27  24 - 37 seconds  CBC WITH DIFFERENTIAL     Status: Abnormal (Preliminary result)   Collection Time    01/20/13  2:59 PM      Result Value Range   WBC 9.8  4.0 - 10.5 K/uL   RBC  4.54  3.87 - 5.11 MIL/uL   Hemoglobin 9.1 (*) 12.0 - 15.0 g/dL   HCT 81.1 (*) 91.4 - 78.2 %   MCV 66.1 (*) 78.0 - 100.0 fL   MCH 20.0 (*) 26.0 - 34.0 pg   MCHC 30.3  30.0 - 36.0 g/dL   RDW 95.6 (*) 21.3 -  15.5 %   Platelets 469 (*) 150 - 400 K/uL   Neutrophils Relative PENDING  43 - 77 %   Neutro Abs PENDING  1.7 - 7.7 K/uL   Band Neutrophils PENDING  0 - 10 %   Lymphocytes Relative PENDING  12 - 46 %   Lymphs Abs PENDING  0.7 - 4.0 K/uL   Monocytes Relative PENDING  3 - 12 %   Monocytes Absolute PENDING  0.1 - 1.0 K/uL   Eosinophils Relative PENDING  0 - 5 %   Eosinophils Absolute PENDING  0.0 - 0.7 K/uL   Basophils Relative PENDING  0 - 1 %   Basophils Absolute PENDING  0.0 - 0.1 K/uL   WBC Morphology PENDING     RBC Morphology PENDING     Smear Review PENDING     nRBC PENDING  0 /100 WBC   Metamyelocytes Relative PENDING     Myelocytes PENDING     Promyelocytes Absolute PENDING     Blasts PENDING    POCT I-STAT, CHEM 8     Status: Abnormal   Collection Time    01/20/13  3:07 PM      Result Value Range   Sodium 143  135 - 145 mEq/L   Potassium 4.1  3.5 - 5.1 mEq/L   Chloride 107  96 - 112 mEq/L   BUN 22  6 - 23 mg/dL   Creatinine, Ser 1.61  0.50 - 1.10 mg/dL   Glucose, Bld 096 (*) 70 - 99 mg/dL   Calcium, Ion 0.45  4.09 - 1.30 mmol/L   TCO2 28  0 - 100 mmol/L   Hemoglobin 11.6 (*) 12.0 - 15.0 g/dL   HCT 81.1 (*) 91.4 - 78.2 %  POCT I-STAT TROPONIN I     Status: None   Collection Time    01/20/13  3:07 PM      Result Value Range   Troponin i, poc 0.00  0.00 - 0.08 ng/mL   Comment 3            Comment: Due to the release kinetics of cTnI,     a negative result within the first hours     of the onset of symptoms does not rule out     myocardial infarction with certainty.     If myocardial infarction is still suspected,     repeat the test at appropriate intervals.  GLUCOSE, CAPILLARY     Status: Abnormal   Collection Time    01/20/13  3:25 PM      Result Value  Range   Glucose-Capillary 122 (*) 70 - 99 mg/dL   Comment 1 Documented in Chart     Comment 2 Notify RN      Ct Head Wo Contrast  01/20/2013  *RADIOLOGY REPORT*  Clinical Data: Leftward gaze.  Left-sided weakness.  Aphasia.  Code stroke.  CT HEAD WITHOUT CONTRAST  Technique:  Contiguous axial images were obtained from the base of the skull through the vertex without contrast.  Comparison: Unenhanced cranial CT 08/27/2011, 10/10/2010, 05/06/2010, 04/07/2010, 11/05/2009.  MRI brain 10/10/2010.  Findings: Moderate to severe cortical and deep atrophy, unchanged. Mild cerebellar atrophy, unchanged.  Mild changes of small vessel disease of the white matter diffusely, unchanged.  No mass lesion. No midline shift.  No acute hemorrhage or hematoma.  No extra-axial fluid collections.  No evidence of acute infarction.  No significant interval change.  No skull fracture or other focal osseous abnormality involving the  skull.  Visualized paranasal sinuses, right mastoid air cells, and both middle ear cavities well-aerated.  Fluid in the left mastoid air cells.  Incidental approximate 8 mm calcified meningioma arising from the right temporal bone in the middle cranial fossa, unchanged.  Mild bilateral carotid siphon atherosclerosis.  IMPRESSION:  1.  No acute intracranial abnormality. 2.  Stable moderate to severe generalized atrophy and mild chronic microvascular ischemic changes of the white matter. 3.  Left mastoid effusion. 4.  Incidental right middle cranial fossa 8 mm meningioma, stable.  These results were called by telephone on 01/20/2013 at 1511 hours to Dr. Leroy Kennedy of Neurology, who verbally acknowledged these results.   Original Report Authenticated By: Hulan Saas, M.D.      Assessment/Plan: 79 years old female with advanced dementia, hypertension, seizures, brought to ED with altered mental status, left gaze preference, and not moving well the left side and then the right side. It thinks to me that  she most likely had a seizure with Todd's paralysis. Will check STAT depakote level and then make further decision accordingly. MRI brain. Will follow up.  Wyatt Portela, MD [facility] [phone]  01/20/2013, 4:05 PM

## 2013-01-21 ENCOUNTER — Inpatient Hospital Stay (HOSPITAL_COMMUNITY): Payer: Medicare Other

## 2013-01-21 ENCOUNTER — Ambulatory Visit (HOSPITAL_COMMUNITY): Payer: Medicare Other

## 2013-01-21 DIAGNOSIS — K5900 Constipation, unspecified: Secondary | ICD-10-CM | POA: Insufficient documentation

## 2013-01-21 DIAGNOSIS — F0390 Unspecified dementia without behavioral disturbance: Secondary | ICD-10-CM

## 2013-01-21 DIAGNOSIS — R569 Unspecified convulsions: Secondary | ICD-10-CM

## 2013-01-21 DIAGNOSIS — I6350 Cerebral infarction due to unspecified occlusion or stenosis of unspecified cerebral artery: Secondary | ICD-10-CM

## 2013-01-21 DIAGNOSIS — F329 Major depressive disorder, single episode, unspecified: Secondary | ICD-10-CM

## 2013-01-21 LAB — GLUCOSE, CAPILLARY
Glucose-Capillary: 83 mg/dL (ref 70–99)
Glucose-Capillary: 103 mg/dL — ABNORMAL HIGH (ref 70–99)

## 2013-01-21 LAB — TSH: TSH: 1.307 u[IU]/mL (ref 0.350–4.500)

## 2013-01-21 LAB — HEMOGLOBIN A1C
Hgb A1c MFr Bld: 6.3 % — ABNORMAL HIGH (ref <5.7)
Mean Plasma Glucose: 134 mg/dL — ABNORMAL HIGH (ref <117)

## 2013-01-21 LAB — VALPROIC ACID LEVEL: Valproic Acid Lvl: 42 ug/mL — ABNORMAL LOW (ref 50.0–100.0)

## 2013-01-21 MED ORDER — LORAZEPAM 2 MG/ML IJ SOLN
INTRAMUSCULAR | Status: AC
  Administered 2013-01-21: 12:00:00
  Filled 2013-01-21: qty 1

## 2013-01-21 MED ORDER — DONEPEZIL HCL 10 MG PO TBDP
10.0000 mg | ORAL_TABLET | Freq: Every day | ORAL | Status: DC
  Filled 2013-01-21: qty 1

## 2013-01-21 MED ORDER — FERROUS SULFATE 220 (44 FE) MG/5ML PO ELIX
330.0000 mg | ORAL_SOLUTION | Freq: Two times a day (BID) | ORAL | Status: DC
  Administered 2013-01-22 – 2013-01-23 (×3): 330 mg via ORAL
  Filled 2013-01-21 (×5): qty 7.5

## 2013-01-21 MED ORDER — VALPROATE SODIUM 500 MG/5ML IV SOLN
500.0000 mg | Freq: Once | INTRAVENOUS | Status: DC
  Administered 2013-01-21: 500 mg via INTRAVENOUS
  Filled 2013-01-21: qty 5

## 2013-01-21 MED ORDER — VITAMIN D3 25 MCG (1000 UNIT) PO TABS
1000.0000 [IU] | ORAL_TABLET | Freq: Every day | ORAL | Status: DC
  Administered 2013-01-22 – 2013-01-23 (×2): 1000 [IU] via ORAL
  Filled 2013-01-21 (×3): qty 1

## 2013-01-21 MED ORDER — DONEPEZIL HCL 10 MG PO TABS
10.0000 mg | ORAL_TABLET | Freq: Every day | ORAL | Status: DC
  Administered 2013-01-22: 10 mg via ORAL
  Filled 2013-01-21 (×3): qty 1

## 2013-01-21 MED ORDER — POLYETHYLENE GLYCOL 3350 17 G PO PACK
34.0000 g | PACK | Freq: Every day | ORAL | Status: DC
  Filled 2013-01-21: qty 2

## 2013-01-21 MED ORDER — ACETAMINOPHEN 500 MG PO TABS
1000.0000 mg | ORAL_TABLET | Freq: Three times a day (TID) | ORAL | Status: DC
  Administered 2013-01-22 – 2013-01-23 (×4): 1000 mg via ORAL
  Filled 2013-01-21 (×7): qty 2

## 2013-01-21 MED ORDER — ASPIRIN 81 MG PO CHEW
81.0000 mg | CHEWABLE_TABLET | Freq: Every day | ORAL | Status: DC
  Administered 2013-01-22 – 2013-01-23 (×2): 81 mg via ORAL
  Filled 2013-01-21 (×2): qty 1

## 2013-01-21 MED ORDER — SERTRALINE HCL 50 MG PO TABS
50.0000 mg | ORAL_TABLET | Freq: Every day | ORAL | Status: DC
  Administered 2013-01-22 – 2013-01-23 (×2): 50 mg via ORAL
  Filled 2013-01-21 (×3): qty 1

## 2013-01-21 MED ORDER — DIVALPROEX SODIUM 125 MG PO CPSP
250.0000 mg | ORAL_CAPSULE | Freq: Two times a day (BID) | ORAL | Status: DC
  Administered 2013-01-22 – 2013-01-23 (×3): 250 mg via ORAL
  Filled 2013-01-21 (×5): qty 2

## 2013-01-21 MED ORDER — LORAZEPAM 2 MG/ML IJ SOLN
1.0000 mg | Freq: Once | INTRAMUSCULAR | Status: AC
  Administered 2013-01-21: 1 mg via INTRAVENOUS

## 2013-01-21 NOTE — Progress Notes (Signed)
Brain MRI showed no acute intracranial abnormality. Patent needs to resume her maintenance dose of Depakote. P[lease, call neurology with any questions or concerns. Will sign off.

## 2013-01-21 NOTE — Evaluation (Signed)
Physical Therapy Evaluation Patient Details Name: Annalisa Colonna MRN: [id number] DOB: 1934-06-02 Today's Date: 01/21/2013 Time: 4098-1191 PT Time Calculation (min): 35 min  PT Assessment / Plan / Recommendation Clinical Impression  Pt is a 79 yo female with advanced dementia and history of seizures admitted for episodes of unresponsiveness at SNF. Pt functioning at baseline per daughter. Pt non-communicative with minimal command follow and comprehension. Pt mobilizes well with max tactile and verbal cues. Pt con't to require assist at SNF for safe mobility and function due to pt inability to care for self. Acute PT to con't to follow to maintain patients strength and level of function.    PT Assessment  Patient needs continued PT services    Follow Up Recommendations  SNF;Supervision/Assistance - 24 hour    Does the patient have the potential to tolerate intense rehabilitation      Barriers to Discharge        Equipment Recommendations  None recommended by PT    Recommendations for Other Services     Frequency Min 2X/week    Precautions / Restrictions Precautions Precautions: Fall Precaution Comments: severe dementia, non-communicative Restrictions Weight Bearing Restrictions: No   Pertinent Vitals/Pain Doesn't appear to be in pain      Mobility  Bed Mobility Bed Mobility: Rolling Right;Rolling Left;Left Sidelying to Sit;Sitting - Scoot to Edge of Bed Rolling Right: 1: +1 Total assist;With rail (due to no command follow and limited comprehension) Rolling Left: 1: +1 Total assist;With rail (due to no command follow and limited comprehension) Left Sidelying to Sit: 1: +2 Total assist;HOB flat (due to no command follow and limited comprehension) Left Sidelying to Sit: Patient Percentage: 20% Sitting - Scoot to Edge of Bed: 1: +1 Total assist Transfers Transfers: Sit to Stand;Stand to Sit Sit to Stand: 2: Max assist;With upper extremity assist Stand to Sit: 2: Max assist;To  chair/3-in-1 Details for Transfer Assistance: maxA required via tactile/verbal cues due to pt with minimal comprehension and ability to follow commands. Ambulation/Gait Ambulation/Gait Assistance: 3: Mod assist Ambulation Distance (Feet): 150 Feet Assistive device: 2 person hand held assist Ambulation/Gait Assistance Details: bilat hand held assist with maxA to provide anterior weight shift to cue for advancement of LEs Gait Pattern: Step-through pattern;Decreased stride length Gait velocity: slow General Gait Details: no episodes of LOB, con't guidance Stairs: No    Exercises     PT Diagnosis: Difficulty walking;Generalized weakness  PT Problem List: Decreased activity tolerance;Decreased balance;Decreased mobility PT Treatment Interventions: Gait training;Functional mobility training   PT Goals Acute Rehab PT Goals PT Goal Formulation: Patient unable to participate in goal setting Time For Goal Achievement: 02/04/13 Potential to Achieve Goals: Good Pt will go Supine/Side to Sit: with min assist;with HOB 0 degrees PT Goal: Supine/Side to Sit - Progress: Goal set today Pt will go Sit to Stand: with min assist PT Goal: Sit to Stand - Progress: Goal set today Pt will Ambulate: >150 feet;with min assist PT Goal: Ambulate - Progress: Goal set today  Visit Information  Last PT Received On: 01/21/13 Assistance Needed: +1 (2nd person helpful for bed mobility/transfer to EOB)    Subjective Data  Subjective: Pt received supine in bed soiled in stool and urine. RN and Consulting civil engineer notified. RN tech assisted with hygiene.   Prior Functioning  Home Living Available Help at Discharge: Skilled Nursing Facility;Available 24 hours/day ([facility] per daughter) Type of Home: Assisted living Prior Function Level of Independence: Needs assistance Needs Assistance: Bathing;Dressing;Feeding;Grooming;Toileting;Meal Prep;Gait;Transfers Bath: Maximal  Dressing: Maximal Feeding:  Minimal Grooming: Maximal Toileting: Maximal Meal Prep: Maximal Gait Assistance: pt ambulates with assist of staff via hand held assist Transfer Assistance: minA for safety due to patient with now volitional mvmt and impaired comprehension Able to Take Stairs?: No Driving: No Vocation: Retired Musician:  (non-communicative at baseline)    Copywriter, advertising Overall Cognitive Status: History of cognitive impairments - at baseline Arousal/Alertness: Awake/alert Orientation Level: Disoriented X4 Behavior During Session: Flat affect Cognition - Other Comments: pt with advanced dementia    Extremity/Trunk Assessment Right Upper Extremity Assessment RUE ROM/Strength/Tone: [facility] for tasks assessed;Unable to fully assess;Due to impaired cognition Left Upper Extremity Assessment LUE ROM/Strength/Tone: [facility] for tasks assessed;Unable to fully assess;Due to impaired cognition Right Lower Extremity Assessment RLE ROM/Strength/Tone: [facility] for tasks assessed;Unable to fully assess;Due to impaired cognition Left Lower Extremity Assessment LLE ROM/Strength/Tone: [facility] for tasks assessed;Unable to fully assess;Due to impaired cognition Trunk Assessment Trunk Assessment: Normal   Balance Balance Balance Assessed: Yes Static Sitting Balance Static Sitting - Balance Support: No upper extremity supported Static Sitting - Level of Assistance: 5: Stand by assistance Static Sitting - Comment/# of Minutes: 5 min Static Standing Balance Static Standing - Balance Support: Bilateral upper extremity supported Static Standing - Level of Assistance: 4: Min assist Static Standing - Comment/# of Minutes: 2 min  End of Session PT - End of Session Equipment Utilized During Treatment: Gait belt Activity Tolerance: Patient tolerated treatment well Patient left: in chair;with call bell/phone within reach;with family/visitor present Nurse Communication: Mobility status (ordered activity belt for pt  to assist with patients anxiety)  GP     Marcene Brawn 01/21/2013, 10:34 AM  Lewis Shock, PT, DPT Pager #: [phone] Office #: [phone]

## 2013-01-21 NOTE — Progress Notes (Signed)
Bilateral:  No evidence of hemodynamically significant internal carotid artery stenosis.   Vertebral artery flow is antegrade.     

## 2013-01-21 NOTE — Progress Notes (Signed)
SLP Cancellation Note  Patient Details Name: Sheryl Jones MRN: [id number] DOB: 08-02-1934   Cancelled treatment:        Patient in [place]. Will f/u 4/2 am.   Ferdinand Lango MA, CCC-SLP [phone]    Ferdinand Lango [name] 01/21/2013, 3:45 PM

## 2013-01-21 NOTE — Progress Notes (Addendum)
NEURO HOSPITALIST PROGRESS NOTE   SUBJECTIVE:                                                                                                                        Sheryl Jones is more alert today. Family stated that she is also speaking better. Her valproid acid level was 19 yesterday and she received a 1 gram bolus IV Depacon. No further neurological developments overnight. The left gaze preference had resolved.   OBJECTIVE:                                                                                                                           Vital signs in last 24 hours: Temp:  [97.6 F (36.4 C)-98.1 F (36.7 C)] 98.1 F (36.7 C) (04/01 0500) Pulse Rate:  [57-68] 64 (04/01 0500) Resp:  [18-21] 20 (04/01 0500) BP: (128-180)/(46-104) 149/52 mmHg (04/01 0500) SpO2:  [100 %] 100 % (04/01 0500)  Intake/Output from previous day:   Intake/Output this shift:   Nutritional status: NPO  Past Medical History  Diagnosis Date  . Dementia   . Hypertension   . Diabetes mellitus   . Iron deficiency anemia   . Hypokalemia   . Depressive disorder   . Anxiety   . Unspecified epilepsy without mention of intractable epilepsy   . Other and unspecified hyperlipidemia   . Colon polyp     Neurologic ROS negative with exception of above.  Neurologic Exam:  Mental status: she is alert and awake and follows simple commands. No dysarthria or dysphasia. CN 2-12: pupils 2 mm bilaterally, reactive to light. EOM full without nystagmus.. No nystagmus. Face is symmetric. Tongue midline.  Motor: moves all limbs spontaneously and symmetrically.  Sensory: no tested. DTR's: 1 + all over.  Plantars: down bilaterally.  Coordination and gait: no tested.   Lab Results: Lab Results  Component Value Date/Time   CHOL 212* 01/21/2013  4:43 AM   Lipid Panel  Recent Labs  01/21/13 0443  CHOL 212*  TRIG 86  HDL 66  CHOLHDL 3.2  VLDL 17  LDLCALC 161*     Studies/Results: Ct Head Wo Contrast  01/20/2013  *RADIOLOGY REPORT*  Clinical Data: Leftward gaze.  Left-sided weakness.  Aphasia.  Code stroke.  CT HEAD WITHOUT CONTRAST  Technique:  Contiguous axial images were obtained from the base of the skull through the vertex without contrast.  Comparison: Unenhanced cranial CT 08/27/2011, 10/10/2010, 05/06/2010, 04/07/2010, 11/05/2009.  MRI brain 10/10/2010.  Findings: Moderate to severe cortical and deep atrophy, unchanged. Mild cerebellar atrophy, unchanged.  Mild changes of small vessel disease of the white matter diffusely, unchanged.  No mass lesion. No midline shift.  No acute hemorrhage or hematoma.  No extra-axial fluid collections.  No evidence of acute infarction.  No significant interval change.  No skull fracture or other focal osseous abnormality involving the skull.  Visualized paranasal sinuses, right mastoid air cells, and both middle ear cavities well-aerated.  Fluid in the left mastoid air cells.  Incidental approximate 8 mm calcified meningioma arising from the right temporal bone in the middle cranial fossa, unchanged.  Mild bilateral carotid siphon atherosclerosis.  IMPRESSION:  1.  No acute intracranial abnormality. 2.  Stable moderate to severe generalized atrophy and mild chronic microvascular ischemic changes of the white matter. 3.  Left mastoid effusion. 4.  Incidental right middle cranial fossa 8 mm meningioma, stable.  These results were called by telephone on 01/20/2013 at 1511 hours to Dr. Leroy Kennedy of Neurology, who verbally acknowledged these results.   Original Report Authenticated By: Hulan Saas, M.D.     MEDICATIONS                                                                                                                       I have reviewed the patient's current medications.  ASSESSMENT/PLAN:                                                                                                           Probable seizure  with Todd's paralysis, resolved. Will ensure that her valproic acid is therapeutic. Awaiting MRI brain. Unless there is an active medical issue, she should be able to be discharge today if MRI and valproic acid level are normal.  Wyatt Portela, MD [facility] [phone]  01/21/2013, 8:09 AM

## 2013-01-21 NOTE — Progress Notes (Signed)
[facility] PROGRESS NOTE  Sheryl Jones VWU:[id number] DOB: [birth year] DOA: 01/20/2013 PCP: Karlene Einstein, MD  Assessment/Plan: 1. Breakthrough seizure - in  A patient with known seizure disorder - patient received iv depacon and she did not have any more seizure activity on 01/21/13. Plan to resume Depakote at 250 BID po on 4/2. ST eval to see if she can swallow 2. Dementia 3. Cosntipation - started on miralax  Code Status: full Family Communication: daughter  Disposition Plan: snf    Consultants:  Neuro   Procedures:  MRI   HPI/Subjective: Asleep post MRI (received ativan)   Objective: Filed Vitals:   01/20/13 1800 01/20/13 2200 01/21/13 0245 01/21/13 0500  BP: 128/46 180/66 153/58 149/52  Pulse: 59 61 57 64  Temp:  97.9 F (36.6 C) 97.8 F (36.6 C) 98.1 F (36.7 C)  TempSrc:  Oral Oral Oral  Resp: 19 20 20 20   SpO2: 100% 100% 100% 100%    Intake/Output Summary (Last 24 hours) at 01/21/13 1833 Last data filed at 01/21/13 1700  Gross per 24 hour  Intake      0 ml  Output      0 ml  Net      0 ml   There were no vitals filed for this visit.  Exam:   General:  asleep  Cardiovascular: rrr  Respiratory: ctab   Abdomen: soft, nt   Musculoskeletal: intact    Data Reviewed: Basic Metabolic Panel:  Recent Labs Lab 01/20/13 1459 01/20/13 1507 01/20/13 2116  NA 143 143  --   K 4.3 4.1  --   CL 103 107  --   CO2 24  --   --   GLUCOSE 121* 126*  --   BUN 20 22  --   CREATININE 0.74 0.80 0.60  CALCIUM 9.4  --   --    Liver Function Tests:  Recent Labs Lab 01/20/13 1459  AST 21  ALT 10  ALKPHOS 90  BILITOT 0.2*  PROT 7.0  ALBUMIN 3.6   No results found for this basename: LIPASE, AMYLASE,  in the last 168 hours No results found for this basename: AMMONIA,  in the last 168 hours CBC:  Recent Labs Lab 01/20/13 1459 01/20/13 1507 01/20/13 2116  WBC 9.8  --  7.9  NEUTROABS 3.5  --   --   HGB 9.1* 11.6* 8.7*  HCT 30.0* 34.0*  28.7*  MCV 66.1*  --  65.5*  PLT 469*  --  411*   Cardiac Enzymes:  Recent Labs Lab 01/20/13 1459  CKTOTAL 43  CKMB 1.6  TROPONINI <0.30   BNP (last 3 results) No results found for this basename: PROBNP,  in the last 8760 hours CBG:  Recent Labs Lab 01/20/13 1525 01/21/13 0546 01/21/13 1153 01/21/13 1714  GLUCAP 122* 107* 83 108*    No results found for this or any previous visit (from the past 240 hour(s)).   Studies: Dg Chest 2 View  01/21/2013  *RADIOLOGY REPORT*  Clinical Data: Stroke.  CHEST - 2 VIEW  Comparison: Chest x-ray 10/10/2010.  Findings: Lung volumes are low. There is cephalization of the pulmonary vasculature and slight indistinctness of the interstitial markings suggestive of mild pulmonary edema.  Mild cardiomegaly. No definite pleural effusions.  Tortuosity of the thoracic aorta which appears ectatic or mildly aneurysmal.  Atherosclerotic calcifications within the thoracic aorta.  IMPRESSION: 1.  Findings, as above, suggestive of mild congestive heart failure. 2.  Low lung volumes. 3.  Atherosclerosis.  There is ectasia or mild aneurysmal dilatation of the thoracic aorta.   Original Report Authenticated By: Trudie Reed, M.D.    Ct Head Wo Contrast  01/20/2013  *RADIOLOGY REPORT*  Clinical Data: Leftward gaze.  Left-sided weakness.  Aphasia.  Code stroke.  CT HEAD WITHOUT CONTRAST  Technique:  Contiguous axial images were obtained from the base of the skull through the vertex without contrast.  Comparison: Unenhanced cranial CT 08/27/2011, 10/10/2010, 05/06/2010, 04/07/2010, 11/05/2009.  MRI brain 10/10/2010.  Findings: Moderate to severe cortical and deep atrophy, unchanged. Mild cerebellar atrophy, unchanged.  Mild changes of small vessel disease of the white matter diffusely, unchanged.  No mass lesion. No midline shift.  No acute hemorrhage or hematoma.  No extra-axial fluid collections.  No evidence of acute infarction.  No significant interval change.  No  skull fracture or other focal osseous abnormality involving the skull.  Visualized paranasal sinuses, right mastoid air cells, and both middle ear cavities well-aerated.  Fluid in the left mastoid air cells.  Incidental approximate 8 mm calcified meningioma arising from the right temporal bone in the middle cranial fossa, unchanged.  Mild bilateral carotid siphon atherosclerosis.  IMPRESSION:  1.  No acute intracranial abnormality. 2.  Stable moderate to severe generalized atrophy and mild chronic microvascular ischemic changes of the white matter. 3.  Left mastoid effusion. 4.  Incidental right middle cranial fossa 8 mm meningioma, stable.  These results were called by telephone on 01/20/2013 at 1511 hours to Dr. Leroy Kennedy of Neurology, who verbally acknowledged these results.   Original Report Authenticated By: Hulan Saas, M.D.    Mr Brain Wo Contrast  01/21/2013  *RADIOLOGY REPORT*  Clinical Data: Stroke  MRI HEAD WITHOUT CONTRAST  Technique:  Multiplanar, multiecho pulse sequences of the brain and surrounding structures were obtained according to standard protocol without intravenous contrast.  Comparison: CT 01/20/2013  Findings: Image quality degraded by motion  Negative for acute infarct.  Advanced atrophy.  Mild chronic ischemic changes in the frontal white matter bilaterally.  Brainstem and cerebellum are intact.  Negative for hemorrhage or mass lesion.  IMPRESSION: Advanced atrophy.  Mild chronic microvascular ischemia.  No acute infarct is identified.   Original Report Authenticated By: Janeece Riggers, M.D.     Scheduled Meds: . acetaminophen  1,000 mg Oral TID  . aspirin  81 mg Oral Daily  . cholecalciferol  1,000 Units Oral Daily  . divalproex  250 mg Oral BID  . donepezil  10 mg Oral QHS  . ferrous sulfate  330 mg Oral BID  . haloperidol lactate  5 mg Intravenous Once  . heparin  5,000 Units Subcutaneous Q8H  . insulin aspart  0-9 Units Subcutaneous TID WC  . [START ON 01/22/2013]  polyethylene glycol  34 g Oral Daily  . sertraline  50 mg Oral Daily   Continuous Infusions:    Principal Problem:   Unspecified constipation Active Problems:   Syncope   Dementia   HTN (hypertension)   Diabetes mellitus   Seizure disorder   Depression   CVA (cerebral infarction)      Hutton Pellicane  [facility] Pager [phone]. If 7PM-7AM, please contact night-coverage at [email or web address], password [email or web address] 01/21/2013, 6:33 PM  LOS: 1 day

## 2013-01-21 NOTE — Progress Notes (Signed)
*  PRELIMINARY RESULTS* Echocardiogram 2D Echocardiogram has been performed.  Sheryl Jones 01/21/2013, 4:54 PM

## 2013-01-21 NOTE — Clinical Social Work Note (Signed)
Clinical Social Work   CSW received consult for pt as she is a resident of [facility]. CSW met with pt's daughter to address consult. CSW also contacted [facility], and pt is able to return when medically ready. Pt's daughter is agreeable to this. Full assessment to follow. Please call with any urgent needs. CSW will continue to follow.   Dede Query, MSW, LCSW [phone]

## 2013-01-21 NOTE — Progress Notes (Signed)
OT Cancellation Note  Patient Details Name: Sheryl Jones MRN: [id number] DOB: 1934/04/22   Cancelled Treatment:    Reason Eval/Treat Not Completed: Patient at procedure or test/ unavailable  Galen Manila 01/21/2013, 2:41 PM

## 2013-01-21 NOTE — Progress Notes (Signed)
Unable to see pt this a.m. Due to going for xray, will check back later today  Lafe Garin, OT

## 2013-01-22 ENCOUNTER — Inpatient Hospital Stay (HOSPITAL_COMMUNITY): Payer: Medicare Other

## 2013-01-22 DIAGNOSIS — G40909 Epilepsy, unspecified, not intractable, without status epilepticus: Secondary | ICD-10-CM

## 2013-01-22 DIAGNOSIS — I10 Essential (primary) hypertension: Secondary | ICD-10-CM

## 2013-01-22 DIAGNOSIS — E119 Type 2 diabetes mellitus without complications: Secondary | ICD-10-CM

## 2013-01-22 LAB — GLUCOSE, CAPILLARY: Glucose-Capillary: 92 mg/dL (ref 70–99)

## 2013-01-22 LAB — CLOSTRIDIUM DIFFICILE BY PCR: Toxigenic C. Difficile by PCR: NEGATIVE

## 2013-01-22 MED ORDER — LISINOPRIL 20 MG PO TABS
20.0000 mg | ORAL_TABLET | Freq: Every day | ORAL | Status: DC
  Administered 2013-01-22 – 2013-01-23 (×2): 20 mg via ORAL
  Filled 2013-01-22 (×2): qty 1

## 2013-01-22 NOTE — Progress Notes (Signed)
Patient ID: Sheryl Jones  female  JXB:[id number]    DOB: 1934-09-18    DOA: 01/20/2013  PCP: Karlene Einstein, MD  Assessment/Plan: Principal Problem:   Seizure disorder - EEG today, resumed depakote, level still low  - MRI negative for any acute CVA   Active Problems:    Dementia with depression - cont zoloft    HTN (hypertension): resumed lisinopril    Diabetes mellitus - cont SSI, started back on diet, dysphagia 3   Diarrhea: likely sec to high dose miralax, Cdiff check was negative  DVT Prophylaxis:  Code Status:  Disposition: back to SNF in AM    Subjective: Large foul smelling BM  Objective: Weight change:   Intake/Output Summary (Last 24 hours) at 01/22/13 1628 Last data filed at 01/21/13 1700  Gross per 24 hour  Intake      0 ml  Output      0 ml  Net      0 ml   Blood pressure 159/93, pulse 61, temperature 98.4 F (36.9 C), temperature source Oral, resp. rate 18, SpO2 99.00%.  Physical Exam: General: Alert and awake, not in any acute distress. CVS: S1-S2 clear, no murmur rubs or gallops Chest: clear to auscultation bilaterally, Abdomen: soft nontender, nondistended, normal bowel sounds, no organomegaly Extremities: no cyanosis, clubbing or edema noted bilaterally   Lab Results: Basic Metabolic Panel:  Recent Labs Lab 01/20/13 1459 01/20/13 1507 01/20/13 2116  NA 143 143  --   K 4.3 4.1  --   CL 103 107  --   CO2 24  --   --   GLUCOSE 121* 126*  --   BUN 20 22  --   CREATININE 0.74 0.80 0.60  CALCIUM 9.4  --   --    Liver Function Tests:  Recent Labs Lab 01/20/13 1459  AST 21  ALT 10  ALKPHOS 90  BILITOT 0.2*  PROT 7.0  ALBUMIN 3.6   No results found for this basename: LIPASE, AMYLASE,  in the last 168 hours No results found for this basename: AMMONIA,  in the last 168 hours CBC:  Recent Labs Lab 01/20/13 1459 01/20/13 1507 01/20/13 2116  WBC 9.8  --  7.9  NEUTROABS 3.5  --   --   HGB 9.1* 11.6* 8.7*  HCT 30.0* 34.0*  28.7*  MCV 66.1*  --  65.5*  PLT 469*  --  411*   Cardiac Enzymes:  Recent Labs Lab 01/20/13 1459  CKTOTAL 43  CKMB 1.6  TROPONINI <0.30   BNP: No components found with this basename: POCBNP,  CBG:  Recent Labs Lab 01/21/13 1153 01/21/13 1714 01/21/13 2229 01/22/13 0633 01/22/13 1147  GLUCAP 83 108* 103* 124* 142*     Micro Results: Recent Results (from the past 240 hour(s))  MRSA PCR SCREENING     Status: None   Collection Time    01/22/13  6:41 AM      Result Value Range Status   MRSA by PCR NEGATIVE  NEGATIVE Final   Comment:            The GeneXpert MRSA Assay (FDA     approved for NASAL specimens     only), is one component of a     comprehensive MRSA colonization     surveillance program. It is not     intended to diagnose MRSA     infection nor to guide or     monitor treatment for     MRSA infections.  CLOSTRIDIUM DIFFICILE BY PCR     Status: None   Collection Time    01/22/13 10:38 AM      Result Value Range Status   C difficile by pcr NEGATIVE  NEGATIVE Final    Studies/Results: Dg Chest 2 View  01/21/2013  *RADIOLOGY REPORT*  Clinical Data: Stroke.  CHEST - 2 VIEW  Comparison: Chest x-ray 10/10/2010.  Findings: Lung volumes are low. There is cephalization of the pulmonary vasculature and slight indistinctness of the interstitial markings suggestive of mild pulmonary edema.  Mild cardiomegaly. No definite pleural effusions.  Tortuosity of the thoracic aorta which appears ectatic or mildly aneurysmal.  Atherosclerotic calcifications within the thoracic aorta.  IMPRESSION: 1.  Findings, as above, suggestive of mild congestive heart failure. 2.  Low lung volumes. 3.  Atherosclerosis.  There is ectasia or mild aneurysmal dilatation of the thoracic aorta.   Original Report Authenticated By: Trudie Reed, M.D.    Ct Head Wo Contrast  01/20/2013  *RADIOLOGY REPORT*  Clinical Data: Leftward gaze.  Left-sided weakness.  Aphasia.  Code stroke.  CT HEAD  WITHOUT CONTRAST  Technique:  Contiguous axial images were obtained from the base of the skull through the vertex without contrast.  Comparison: Unenhanced cranial CT 08/27/2011, 10/10/2010, 05/06/2010, 04/07/2010, 11/05/2009.  MRI brain 10/10/2010.  Findings: Moderate to severe cortical and deep atrophy, unchanged. Mild cerebellar atrophy, unchanged.  Mild changes of small vessel disease of the white matter diffusely, unchanged.  No mass lesion. No midline shift.  No acute hemorrhage or hematoma.  No extra-axial fluid collections.  No evidence of acute infarction.  No significant interval change.  No skull fracture or other focal osseous abnormality involving the skull.  Visualized paranasal sinuses, right mastoid air cells, and both middle ear cavities well-aerated.  Fluid in the left mastoid air cells.  Incidental approximate 8 mm calcified meningioma arising from the right temporal bone in the middle cranial fossa, unchanged.  Mild bilateral carotid siphon atherosclerosis.  IMPRESSION:  1.  No acute intracranial abnormality. 2.  Stable moderate to severe generalized atrophy and mild chronic microvascular ischemic changes of the white matter. 3.  Left mastoid effusion. 4.  Incidental right middle cranial fossa 8 mm meningioma, stable.  These results were called by telephone on 01/20/2013 at 1511 hours to Dr. Leroy Kennedy of Neurology, who verbally acknowledged these results.   Original Report Authenticated By: Hulan Saas, M.D.    Mr Brain Wo Contrast  01/21/2013  *RADIOLOGY REPORT*  Clinical Data: Stroke  MRI HEAD WITHOUT CONTRAST  Technique:  Multiplanar, multiecho pulse sequences of the brain and surrounding structures were obtained according to standard protocol without intravenous contrast.  Comparison: CT 01/20/2013  Findings: Image quality degraded by motion  Negative for acute infarct.  Advanced atrophy.  Mild chronic ischemic changes in the frontal white matter bilaterally.  Brainstem and cerebellum are  intact.  Negative for hemorrhage or mass lesion.  IMPRESSION: Advanced atrophy.  Mild chronic microvascular ischemia.  No acute infarct is identified.   Original Report Authenticated By: Janeece Riggers, M.D.     Medications: Scheduled Meds: . acetaminophen  1,000 mg Oral TID  . aspirin  81 mg Oral Daily  . cholecalciferol  1,000 Units Oral Daily  . divalproex  250 mg Oral BID  . donepezil  10 mg Oral QHS  . ferrous sulfate  330 mg Oral BID  . haloperidol lactate  5 mg Intravenous Once  . heparin  5,000 Units Subcutaneous Q8H  . insulin aspart  0-9 Units Subcutaneous TID WC  . sertraline  50 mg Oral Daily      LOS: 2 days   Mykell Genao M.D. [facility] 01/22/2013, 4:28 PM Pager: [phone]  If 7PM-7AM, please contact night-coverage [email or web address] Password [email or web address]

## 2013-01-22 NOTE — Evaluation (Addendum)
Clinical/Bedside Swallow Evaluation Patient Details  Name: Sheryl Jones MRN: [id number] Date of Birth: 10-22-1934  Today's Date: 01/22/2013 Time: 4098-1191 SLP Time Calculation (min): 16 min  Past Medical History:  Past Medical History  Diagnosis Date  . Dementia   . Hypertension   . Diabetes mellitus   . Iron deficiency anemia   . Hypokalemia   . Depressive disorder   . Anxiety   . Unspecified epilepsy without mention of intractable epilepsy   . Other and unspecified hyperlipidemia   . Colon polyp    Past Surgical History: History reviewed. No pertinent past surgical history. HPI:  Sheryl Jones is a 79 y.o. female pmh significant for seizure disorder, HTN, DM and dementia; transferred from SNF due to an episode of responsiveness, left gaze preference and seizure activity. MRI negative for CVA,    Assessment / Plan / Recommendation Clinical Impression  Pt demonstrates ability to orally prepare and swallow liquids and solids WFL. Pt required initial tactile cues for awareness of PO due to mental status (alert but confused). Pt then accepted all textures willingly without evidence of pocketing or aspriation. Recommend Dys 3 (mechanical soft) and then liquids due to missing dentition and pts inability to cut or set up her own tray to facilitate intake with assist. No SLP f/u warranted. If family desires regular diet, she may upgrade.  Pt also with SLP eval and treat order associated with stroke order set. Given baseline dementia and negative MRI, will defer cognitive linguistic eval to next level of care if worsened mentation persists.     Aspiration Risk  Mild    Diet Recommendation Dysphagia 3 (Mechanical Soft);Thin liquid   Liquid Administration via: Cup;Straw Medication Administration: Whole meds with liquid Supervision:  (Assist as needed) Compensations: Slow rate;Small sips/bites Postural Changes and/or Swallow Maneuvers: Seated upright 90 degrees    Other  Recommendations  Oral Care Recommendations: Oral care BID   Follow Up Recommendations  None    Frequency and Duration        Pertinent Vitals/Pain NA    SLP Swallow Goals     Swallow Study Prior Functional Status       General HPI: Sheryl Jones is a 79 y.o. female pmh significant for seizure disorder, HTN, DM and dementia; transferred from SNF due to an episode of responsiveness, left gaze preference and seizure activity. MRI negative for CVA,  Type of Study: Bedside swallow evaluation Previous Swallow Assessment: none reported Diet Prior to this Study: NPO Temperature Spikes Noted: No Respiratory Status: Room air History of Recent Intubation: No Behavior/Cognition: Alert;Cooperative Oral Cavity - Dentition: Missing dentition;Poor condition Self-Feeding Abilities: Total assist Patient Positioning: Upright in bed Baseline Vocal Quality: Clear Volitional Cough: Cognitively unable to elicit Volitional Swallow: Unable to elicit    Oral/Motor/Sensory Function Overall Oral Motor/Sensory Function: Appears within functional limits for tasks assessed (does not follow commands, no focal weakness)   Ice Chips     Thin Liquid Thin Liquid: Within functional limits Presentation: Cup;Spoon    Nectar Thick Nectar Thick Liquid: Not tested   Honey Thick Honey Thick Liquid: Not tested   Puree Puree: Within functional limits   Solid   GO    Solid: Within functional limits (despite missing dentition)      Harlon Ditty, MA CCC-SLP [phone]  Sheryl Jones, Riley Nearing 01/22/2013,7:51 AM

## 2013-01-22 NOTE — Progress Notes (Signed)
OT Cancellation Note  Patient Details Name: Sheryl Jones MRN: [id number] DOB: Oct 18, 1934   Cancelled Treatment:    Reason Eval/Treat Not Completed: Other (comment) Pt from SNF and plans to D/C to SNF. Will defer OT to SNF as appropriate. OT signing off. [facility] Danean Marner, OTR/L  [phone] 01/22/2013 01/22/2013, 3:52 PM

## 2013-01-22 NOTE — Clinical Social Work Psychosocial (Signed)
Clinical Social Work Department BRIEF PSYCHOSOCIAL ASSESSMENT 01/22/2013  Patient:  Sheryl Jones     Account Number:  [id number]     Admit date:  01/20/2013  Clinical Social Worker:  Peggyann Shoals  Date/Time:  01/21/2013 05:00 PM  Referred by:  Physician  Date Referred:  01/21/2013 Referred for  Other - See comment   Other Referral:   Pt was admitted from facility.   Interview type:  Family Other interview type:    PSYCHOSOCIAL DATA Living Status:  FACILITY Admitted from facility:  [facility] Level of care:  Skilled Nursing Facility Primary support name:  Sheryl Jones Primary support relationship to patient:  CHILD, ADULT Degree of support available:   supportive    CURRENT CONCERNS Current Concerns  Post-Acute Placement   Other Concerns:    SOCIAL WORK ASSESSMENT / PLAN CSW met with pt's daughter to addres consult on 01/21/2013. CSW introduced herself and explained role of social work. CSW also explained the process of discharging and returning to SNF.    Pt was off the unit at a procedure. Per pt's duaghter, pt has been a resident of [facility] for the past 2 years. Pt's daughter is agreeable to pt returning at discharge.    CSW contacted [facility]'s admissions coordinator. Pt is able to return to facility when medically ready. CSW will initate process of returning to [facility] at discharge. CSW will continue to follow.   Assessment/plan status:  Psychosocial Support/Ongoing Assessment of Needs Other assessment/ plan:   Information/referral to Sheryl resources:   Pt is a long term resident.    PATIENT'S/FAMILY'S RESPONSE TO PLAN OF CARE: Pt was off the unit during interview. Pt's daughter was very pleasant and was agreeable to discharge.   Dede Query, MSW, LCSW [phone]

## 2013-01-22 NOTE — Progress Notes (Signed)
EEG  Completed; results pending. 

## 2013-01-23 MED ORDER — HYDRALAZINE HCL 10 MG PO TABS
15.0000 mg | ORAL_TABLET | Freq: Four times a day (QID) | ORAL | Status: DC
  Administered 2013-01-23: 15 mg via ORAL
  Filled 2013-01-23 (×3): qty 2

## 2013-01-23 MED ORDER — SENNOSIDES-DOCUSATE SODIUM 8.6-50 MG PO TABS: 1.0000 | ORAL_TABLET | Freq: Every day | ORAL | Status: AC

## 2013-01-23 MED ORDER — DIVALPROEX SODIUM 125 MG PO CPSP: 250.0000 mg | ORAL_CAPSULE | Freq: Two times a day (BID) | ORAL | Status: DC

## 2013-01-23 MED ORDER — SERTRALINE HCL 50 MG PO TABS: 50.0000 mg | ORAL_TABLET | Freq: Every day | ORAL | Status: DC

## 2013-01-23 NOTE — Progress Notes (Signed)
Physical Therapy Treatment Patient Details Name: Sheryl Jones MRN: [id number] DOB: 1934-08-05 Today's Date: 01/23/2013 Time: 0940-1005 PT Time Calculation (min): 25 min  PT Assessment / Plan / Recommendation Comments on Treatment Session  +2 is helpful for bed mobility due to decreased ability to follow commands.  Pt responds to her first name and to tactile cues for bed mobiliity.  Pt seemed to respond to verbal cues "Let's walk to the window" during ambulation.      Follow Up Recommendations  SNF;Supervision/Assistance - 24 hour     Does the patient have the potential to tolerate intense rehabilitation     Barriers to Discharge        Equipment Recommendations  None recommended by PT    Recommendations for Other Services    Frequency Min 2X/week   Plan Discharge plan remains appropriate;Frequency remains appropriate    Precautions / Restrictions Precautions Precautions: Fall Precaution Comments: severe dementia, non-communicative Restrictions Weight Bearing Restrictions: No   Pertinent Vitals/Pain No obvious signs of pain or discomfort were observed.    Mobility  Bed Mobility Bed Mobility: Supine to Sit Supine to Sit: 1: +2 Total assist;HOB elevated Supine to Sit: Patient Percentage: 30% Sitting - Scoot to Edge of Bed: 1: +1 Total assist Details for Bed Mobility Assistance: (A) due to limited comprehension and limited ability to follow directions.   Transfers Transfers: Sit to Stand;Stand to Sit Sit to Stand: 2: Max assist;With upper extremity assist;From bed Stand to Sit: 2: Max assist;To chair/3-in-1 Details for Transfer Assistance: maxA required via tactile/verbal cues due to pt with minimal comprehension and ability to follow commands. Ambulation/Gait Ambulation/Gait Assistance: 3: Mod assist Ambulation Distance (Feet): 250 Feet Assistive device: 2 person hand held assist Ambulation/Gait Assistance Details: 2 HHA with cue to walk towards window at end of the hall.   One seated rest. Gait Pattern: Step-through pattern;Decreased stride length Gait velocity: slow Stairs: No    Exercises     PT Diagnosis:    PT Problem List:   PT Treatment Interventions:     PT Goals Acute Rehab PT Goals Time For Goal Achievement: 02/04/13 Potential to Achieve Goals: Good Pt will go Supine/Side to Sit: with min assist;with HOB 0 degrees PT Goal: Supine/Side to Sit - Progress: Progressing toward goal Pt will go Sit to Stand: with min assist PT Goal: Sit to Stand - Progress: Progressing toward goal Pt will Ambulate: >150 feet;with min assist PT Goal: Ambulate - Progress: Progressing toward goal  Visit Information  Last PT Received On: 01/23/13 Assistance Needed: +1 ((2nd person helpful for bed mobility/transfer EOB))    Subjective Data  Subjective: No indication of pain or discomfort   Cognition  Cognition Overall Cognitive Status: History of cognitive impairments - at baseline Arousal/Alertness: Awake/alert Orientation Level: Disoriented X4 Behavior During Session: WFL for tasks performed Cognition - Other Comments: Pt responded "fine" when asked how she was feeling.    Balance     End of Session PT - End of Session Equipment Utilized During Treatment: Gait belt Activity Tolerance: Patient tolerated treatment well Patient left: in chair;with call bell/phone within reach Nurse Communication: Mobility status   GP     Enid Baas, SPTA 01/23/2013, 10:46 AM

## 2013-01-23 NOTE — Progress Notes (Signed)
[facility] here to transport pt to [facility].

## 2013-01-23 NOTE — Procedures (Signed)
EEG#:  [id number].  REFERRING PHYSICIAN:  Rosanna Randy, MD  INDICATION FOR STUDY:  A 79 year old lady with history of seizure disorder as well as dementia, who was admitted for evaluation of altered mental status with possible focal seizure activity during an episode of unconsciousness.  DESCRIPTION:  This is a routine EEG recording performed during wakefulness.  Predominant background activity consisted of low amplitude, 1-2 Hz diffuse continuous symmetrical delta activity with superimposed.  Brief occurrences of bilateral 5-6 Hz theta activity, which occurred fairly frequently.  Photic stimulation produced a minimal occipital driving response bilaterally.  Hyperventilation was not performed.  No epileptiform discharges were recorded.  INTERPRETATION:  The EEG showed moderately severe nonspecific generalized continuous slowing of cerebral activity, consistent with the patient's history of chronic dementia.  Slowing of this type can also be seen with encephalopathy secondary to metabolic encephalopathy.  No evidence of seizure activity was demonstrated.     Noel Christmas, MD    [id number] D:  01/22/2013 11:07:38  T:  01/23/2013 00:39:15  Job #:  [id number]

## 2013-01-23 NOTE — Discharge Summary (Signed)
Physician Discharge Summary  Patient ID: Sheryl Jones MRN: [id number] DOB/AGE: [birth year] 79 y.o.  Admit date: 01/20/2013 Discharge date: 01/23/2013  Primary Care Physician:  Karlene Einstein, MD  Discharge Diagnoses:   . Seizure disorder with breakthrough seizure . Dementia . HTN (hypertension) . Diabetes mellitus . Depression . CVA (cerebral infarction) . Syncope Constipation  Consults: Neurology    Discharge Instructions:   Discharge Medications:   Medication List    TAKE these medications       acetaminophen 500 MG tablet  Commonly known as:  TYLENOL  Take 1,000 mg by mouth 3 (three) times daily.     aspirin 81 MG chewable tablet  Chew 81 mg by mouth daily.     cholecalciferol 1000 UNITS tablet  Commonly known as:  VITAMIN D  Take 1,000 Units by mouth daily.     divalproex 125 MG capsule  Commonly known as:  DEPAKOTE SPRINKLE  Take 2 capsules (250 mg total) by mouth 2 (two) times daily.     donepezil 10 MG disintegrating tablet  Commonly known as:  ARICEPT ODT  Take 10 mg by mouth at bedtime.     ferrous sulfate 220 (44 FE) MG/5ML solution  Take 330 mg by mouth 2 (two) times daily.     hydrALAZINE 10 MG tablet  Commonly known as:  APRESOLINE  Take 15 mg by mouth 4 (four) times daily.     lisinopril 20 MG tablet  Commonly known as:  PRINIVIL,ZESTRIL  Take 20 mg by mouth daily.     metFORMIN 500 MG tablet  Commonly known as:  GLUCOPHAGE  Take 500 mg by mouth 2 (two) times daily with a meal.     senna-docusate 8.6-50 MG per tablet  Commonly known as:  SENOKOT S  Take 1 tablet by mouth at bedtime.     sertraline 50 MG tablet  Commonly known as:  ZOLOFT  Take 1 tablet (50 mg total) by mouth daily.         Brief H and P: For complete details please refer to admission H and P, but in brief Sheryl Jones is a 79 y.o. female with past medical history of dementia, hypertension, seizures disorder, HTN, DM and dementia; transferred from SNF due to an  episode of responsiveness, left gaze preference and seizure activity. At baseline, Sheryl Jones is not able to communicate but on the day of admission, around 2 pm she sat down in the toilet, had a bowel movement, apparently passed out and had involuntary movements of the right arm. Patient brought to ED immediately; CT brain showed no acute intracranial abnormalities. Initially was called as code stroke in ED; but neurology favored seizure and canceled the code stroke.    Hospital Course:  Seizure disorder: Likely breakthrough seizure. Neurology was consulted and patient underwent MRI of the brain which was negative for any acute CVA. EEG was done which showed moderately severe nonspecific generalized continuous slowing of cerebral activity consistent with history of chronic dementia but no evidence of seizure activity was demonstrated. Patient was resumed on depakote.   Dementia with depression: Continue Aricept with zoloft   HTN (hypertension): resumed lisinopril and hydralazine  Diabetes mellitus - cont SSI, started back on diet, dysphagia 3   Diarrhea: likely sec to high dose miralax, Cdiff check was negative. Patient was constipated at the time of admission. She should continue small dose of stool softeners at bedtime to avoid constipation.    Day of Discharge BP 142/66  Pulse 67  Temp(Src) 98 F (36.7 C) (Oral)  Resp 16  SpO2 96%  Physical Exam:  General: Alert and awake,  CVS: S1-S2 clear, Chest: CTAB,  Abdomen: soft nontender, nondistended, normal bowel sounds Extremities: no cyanosis, clubbing or edema noted bilaterally   The results of significant diagnostics from this hospitalization (including imaging, microbiology, ancillary and laboratory) are listed below for reference.    LAB RESULTS: Basic Metabolic Panel:  Recent Labs Lab 01/20/13 1459 01/20/13 1507 01/20/13 2116  NA 143 143  --   K 4.3 4.1  --   CL 103 107  --   CO2 24  --   --   GLUCOSE 121* 126*  --    BUN 20 22  --   CREATININE 0.74 0.80 0.60  CALCIUM 9.4  --   --    Liver Function Tests:  Recent Labs Lab 01/20/13 1459  AST 21  ALT 10  ALKPHOS 90  BILITOT 0.2*  PROT 7.0  ALBUMIN 3.6   No results found for this basename: LIPASE, AMYLASE,  in the last 168 hours No results found for this basename: AMMONIA,  in the last 168 hours CBC:  Recent Labs Lab 01/20/13 1459 01/20/13 1507 01/20/13 2116  WBC 9.8  --  7.9  NEUTROABS 3.5  --   --   HGB 9.1* 11.6* 8.7*  HCT 30.0* 34.0* 28.7*  MCV 66.1*  --  65.5*  PLT 469*  --  411*   Cardiac Enzymes:  Recent Labs Lab 01/20/13 1459  CKTOTAL 43  CKMB 1.6  TROPONINI <0.30   BNP: No components found with this basename: POCBNP,  CBG:  Recent Labs Lab 01/23/13 0657 01/23/13 1120  GLUCAP 90 128*    Significant Diagnostic Studies:  Dg Chest 2 View  01/21/2013  *RADIOLOGY REPORT*  Clinical Data: Stroke.  CHEST - 2 VIEW  Comparison: Chest x-ray 10/10/2010.  Findings: Lung volumes are low. There is cephalization of the pulmonary vasculature and slight indistinctness of the interstitial markings suggestive of mild pulmonary edema.  Mild cardiomegaly. No definite pleural effusions.  Tortuosity of the thoracic aorta which appears ectatic or mildly aneurysmal.  Atherosclerotic calcifications within the thoracic aorta.  IMPRESSION: 1.  Findings, as above, suggestive of mild congestive heart failure. 2.  Low lung volumes. 3.  Atherosclerosis.  There is ectasia or mild aneurysmal dilatation of the thoracic aorta.   Original Report Authenticated By: Trudie Reed, M.D.    Ct Head Wo Contrast  01/20/2013  * IMPRESSION:  1.  No acute intracranial abnormality. 2.  Stable moderate to severe generalized atrophy and mild chronic microvascular ischemic changes of the white matter. 3.  Left mastoid effusion. 4.  Incidental right middle cranial fossa 8 mm meningioma, stable.  These results were called by telephone on 01/20/2013 at 1511 hours to  Dr. Leroy Kennedy of Neurology, who verbally acknowledged these results.   Original Report Authenticated By: Hulan Saas, M.D.    Mr Brain Wo Contrast  01/21/2013  *  IMPRESSION: Advanced atrophy.  Mild chronic microvascular ischemia.  No acute infarct is identified.   Original Report Authenticated By: Janeece Riggers, M.D.    EEG 01/22/13 INTERPRETATION: The EEG showed moderately severe nonspecific  generalized continuous slowing of cerebral activity, consistent with the  patient's history of chronic dementia. Slowing of this type can also be  seen with encephalopathy secondary to metabolic encephalopathy. No  evidence of seizure activity was demonstrated.    Disposition and Follow-up: Discharge Orders   Future Orders Complete By  Expires     Discharge instructions  As directed     Comments:      Discharge diet: Dysphagia 3    Increase activity slowly  As directed         DISPOSITION: SNF  DIET: dysphagia 3, thin liquids ACTIVITY: as tolerated  DISCHARGE FOLLOW-UP Follow-up Information   Follow up with [facility], [place]. Schedule an appointment as soon as possible for a visit in 10 days. (for hospital follow-up)    Contact information:   [address] [place] [address] [phone]       Time spent on Discharge: 40 mins  Signed:   Hubbert Landrigan M.D. [facility] 01/23/2013, 12:00 PM Pager: [phone]

## 2013-01-23 NOTE — Clinical Social Work Note (Signed)
Clinical Social Work   Pt is ready for discharge to [facility]. Facility has received discharge summary and is ready to admit pt. Pt and family are agreeable to discharge plan. [facility] will provide transportation. CSW is signing off as no further needs identified.   Dede Query, MSW, LCSW [phone]

## 2013-01-24 NOTE — Progress Notes (Signed)
[name], PTA [phone] 01/24/2013  

## 2013-01-27 ENCOUNTER — Non-Acute Institutional Stay (SKILLED_NURSING_FACILITY): Payer: Medicare Other | Admitting: Internal Medicine

## 2013-01-27 DIAGNOSIS — I10 Essential (primary) hypertension: Secondary | ICD-10-CM

## 2013-01-27 DIAGNOSIS — E119 Type 2 diabetes mellitus without complications: Secondary | ICD-10-CM

## 2013-01-27 DIAGNOSIS — R569 Unspecified convulsions: Secondary | ICD-10-CM

## 2013-01-27 DIAGNOSIS — F0390 Unspecified dementia without behavioral disturbance: Secondary | ICD-10-CM

## 2013-02-10 NOTE — Progress Notes (Signed)
Patient ID: Sheryl Jones, female   DOB: 06-12-1934, 79 y.o.   MRN: [id number]        HISTORY & PHYSICAL  DATE:  01/27/2013  FACILITY: [facility]   LEVEL OF CARE: SNF   ALLERGIES:  No Known Allergies   CHIEF COMPLAINT:  Manage seizure disorder, diabetes mellitus and hypertension.   HISTORY OF PRESENT ILLNESS:  79 year-old, African-American female who was hospitalized secondary to an episode of unresponsiveness and seizure activity.      SEIZURE DISORDER: CT of the brain did not show any acute findings.  In the hospital, Neurology was consulted as well.  MRI of the brain was negative for acute findings.  EEG showed moderately severe nonspecific generalized continuous slowing of cerebral with history of chronic dementia, but no evidence of seizure activity.  She was resumed on her Depakote.   The patient's seizure disorder remains stable. No complications reported from the medications presently being used. Staff do not report any recent seizure activity.  The patient does not follow commands due to advanced dementia.  She is admitted back to the facility for long-term care management.    DM:pt's DM remains stable.  Staff denies polyuria, polydipsia, polyphagia, changes in vision or hypoglycemic episodes.  No complications noted from the medication presently being used.  Last hemoglobin A1c is:  6.8 in 10/2012.    HTN: Pt 's HTN remains stable.  Staff denies CP, sob, DOE, pedal edema, headaches, dizziness or visual disturbances.  No complications from the medications currently being used.  Last BP :  138/62.  PAST MEDICAL HISTORY : (per record) Past Medical History  Diagnosis Date  . Dementia   . Hypertension   . Diabetes mellitus   . Iron deficiency anemia   . Hypokalemia   . Depressive disorder   . Anxiety   . Unspecified epilepsy without mention of intractable epilepsy   . Other and unspecified hyperlipidemia   . Colon polyp    PAST SURGICAL HISTORY:none  SOCIAL HISTORY: (per  record)  reports that she has quit smoking. Her smoking use included Cigarettes. She smoked 0.00 packs per day. She has never used smokeless tobacco. She reports that she does not drink alcohol or use illicit drugs.   FAMILY HISTORY: (per record) Family History  Problem Relation Age of Onset  . Heart attack Mother   . Heart attack Father   . Breast cancer Sister     meta  . Hypertension Father   . Hypertension Daughter    CURRENT MEDICATIONS: Reviewed per [facility]  REVIEW OF SYSTEMS:  Unobtainable due to dementia.    PHYSICAL EXAMINATION  VS:  T  98      P 64      RR  18     BP  138/62     POX%        WT (Lb)  GENERAL: no acute distress, morbidly obese body habitus SKIN: warm & dry, no suspicious lesions or rashes, no excessive dryness EYES: conjunctivae normal, sclerae normal, normal eye lids MOUTH/THROAT: unable to assess  NECK: supple, trachea midline, no neck masses, no thyroid tenderness, no thyromegaly LYMPHATICS: no LAN in the neck, no supraclavicular LAN RESPIRATORY: breathing is even & unlabored, BS CTAB CARDIAC: RRR, no murmur,no extra heart sounds, no edema GI:  ABDOMEN: abdomen soft, normal BS, no masses, no tenderness  LIVER/SPLEEN: no hepatomegaly, no splenomegaly MUSCULOSKELETAL: unable to assess  PSYCHIATRIC: the patient is alert, disoriented, affect & behavior appropriate  LABS/RADIOLOGY:  Basic  metabolic panel:  Glucose 126, otherwise BMP normal.    Liver function tests:  Normal.  CBC:  Hemoglobin 8.7, MCV 65.5, platelet count 411, white count 7.9.  Cardiac enzymes:  Total CK 43, Troponin-I less than 0.03.   Chest x-ray:  Mild CHF.     ASSESSMENT/PLAN:  Seizure disorder.  Now stable.    Diabetes mellitus.  Well controlled.   Hypertension.  Well controlled.   Dementia.  Advanced.    History of CVA.  Stable.   Iron deficiency anemia.  Continue iron.  Reassess hemoglobin level.   Check CBC and BMP.    I have reviewed patient's medical  records received at admission/from hospitalization.  CPT CODE: [address]

## 2013-02-24 ENCOUNTER — Non-Acute Institutional Stay (SKILLED_NURSING_FACILITY): Payer: Medicare Other | Admitting: Internal Medicine

## 2013-02-24 DIAGNOSIS — G309 Alzheimer's disease, unspecified: Secondary | ICD-10-CM

## 2013-02-24 DIAGNOSIS — F0280 Dementia in other diseases classified elsewhere without behavioral disturbance: Secondary | ICD-10-CM

## 2013-02-24 DIAGNOSIS — I10 Essential (primary) hypertension: Secondary | ICD-10-CM

## 2013-02-24 DIAGNOSIS — E119 Type 2 diabetes mellitus without complications: Secondary | ICD-10-CM

## 2013-02-24 DIAGNOSIS — D509 Iron deficiency anemia, unspecified: Secondary | ICD-10-CM

## 2013-02-28 NOTE — Progress Notes (Signed)
PROGRESS NOTE  DATE: 02-24-13  FACILITY: Nursing Home Location: [facility]  LEVEL OF CARE: SNF (31)  Routine Visit  CHIEF COMPLAINT:  Manage diabetes mellitus, hypertension and dementia  HISTORY OF PRESENT ILLNESS:  REASSESSMENT OF ONGOING PROBLEM(S):  1.DM:pt's DM remains stable.  Staff deny polyuria, polydipsia, polyphagia, changes in vision or hypoglycemic episodes.  No complications noted from the medication presently being used.  Last hemoglobin A1c is: 6.8 and 1/14.  2. HTN: Pt 's HTN remains stable.   Staff Deny CP, sob, DOE, pedal edema, headaches, dizziness or visual disturbances.  No complications from the medications currently being used.  Last BP : 107/86.  3. DEMENTIA: The dementia remaines stable and continues to function adequately in the current living environment with supervision.  The patient has had little changes in behavior. No complications noted from the medications presently being used. Patient is a poor historian.  PAST MEDICAL HISTORY : Reviewed.  No changes.  CURRENT MEDICATIONS: Reviewed per [facility]  REVIEW OF SYSTEMS: unobtainable due to dementia.  PHYSICAL EXAMINATION  VS:  T99       P61      RR18      BP 107/86     POX %     WT (Lb) 215  GENERAL: no acute distress, obese body habitus EYES: conjunctivae normal, sclerae normal, normal eye lids NECK: supple, trachea midline, no neck masses, no thyroid tenderness, no thyromegaly LYMPHATICS: no LAN in the neck, no supraclavicular LAN RESPIRATORY: breathing is even & unlabored, BS CTAB CARDIAC: RRR, no murmur,no extra heart sounds, +2 bilateral edema GI: abdomen soft, normal BS, no masses, no tenderness, no hepatomegaly, no splenomegaly PSYCHIATRIC: the patient is alert & disoriented, affect & behavior appropriate  LABS/RADIOLOGY:  4/14 Depakote 41, imipramine 9.4, MCV 69, platelets 490, WBC 6.5, BMP normal 3/14 total protein 5.7 otherwise CMP normal  ASSESSMENT/PLAN:  1.  diabetes mellitus-well controlled. 2. hypertension-stable. 3. Alzheimer's dementia-advanced. 4. Iron deficiency anemia-stable. 5. hypokalemia-well-controlled. 6. constipation-well controlled. 7. depression-stable.  CPT CODE: [address]

## 2013-09-08 ENCOUNTER — Non-Acute Institutional Stay (SKILLED_NURSING_FACILITY): Payer: Medicare Other | Admitting: Internal Medicine

## 2013-09-08 DIAGNOSIS — E119 Type 2 diabetes mellitus without complications: Secondary | ICD-10-CM

## 2013-09-08 DIAGNOSIS — F068 Other specified mental disorders due to known physiological condition: Secondary | ICD-10-CM

## 2013-09-08 DIAGNOSIS — F0390 Unspecified dementia without behavioral disturbance: Secondary | ICD-10-CM

## 2013-09-08 DIAGNOSIS — F3289 Other specified depressive episodes: Secondary | ICD-10-CM

## 2013-09-08 DIAGNOSIS — N390 Urinary tract infection, site not specified: Secondary | ICD-10-CM

## 2013-09-08 DIAGNOSIS — I10 Essential (primary) hypertension: Secondary | ICD-10-CM

## 2013-09-08 DIAGNOSIS — E870 Hyperosmolality and hypernatremia: Secondary | ICD-10-CM

## 2013-09-08 DIAGNOSIS — F329 Major depressive disorder, single episode, unspecified: Secondary | ICD-10-CM

## 2013-09-08 DIAGNOSIS — G40909 Epilepsy, unspecified, not intractable, without status epilepticus: Secondary | ICD-10-CM

## 2013-09-10 ENCOUNTER — Encounter: Payer: Self-pay | Admitting: Internal Medicine

## 2013-09-10 DIAGNOSIS — E870 Hyperosmolality and hypernatremia: Secondary | ICD-10-CM | POA: Insufficient documentation

## 2013-09-10 DIAGNOSIS — N390 Urinary tract infection, site not specified: Secondary | ICD-10-CM | POA: Insufficient documentation

## 2013-09-10 NOTE — Assessment & Plan Note (Signed)
Secondary to poor po intake from dementia; family discussion at hospital-do not want PEG tube and do understand that this problem could recur.

## 2013-09-10 NOTE — Assessment & Plan Note (Signed)
With e coli; cipro sensitive which needs to be given until 11/20

## 2013-09-10 NOTE — Assessment & Plan Note (Signed)
Bp meds were adjusted at hospital;BP a little high today;will watch and see if need to add something else

## 2013-09-10 NOTE — Progress Notes (Signed)
MRN: [id number] Name: Sheryl Jones  Sex: female Age: 79 y.o. DOB: October 28, 1933  PSC #: [facility]andy Facility/Room: 302A Level Of Care: SNF Provider: Merrilee Seashore D Emergency Contacts: Extended Emergency Contact Information Primary Emergency Contact: Jolene Provost of [place] Home Phone: [phone] Relation: Daughter Secondary Emergency Contact: [place], [place], [place] Home Phone: [phone] Relation: Other  Code Status: DNR  Allergies: Review of patient's allergies indicates no known allergies.  Chief Complaint  Patient presents with  . nursing home admission    HPI: Patient is 79 y.o. female who has severe dementia and is for chronic care.  Past Medical History  Diagnosis Date  . Dementia   . Hypertension   . Diabetes mellitus   . Iron deficiency anemia   . Hypokalemia   . Depressive disorder   . Anxiety   . Unspecified epilepsy without mention of intractable epilepsy   . Other and unspecified hyperlipidemia   . Colon polyp     History reviewed. No pertinent past surgical history.    Medication List       This list is accurate as of: 09/08/13 11:59 PM.  Always use your most recent med list.               acetaminophen 500 MG tablet  Commonly known as:  TYLENOL  Take 1,000 mg by mouth 3 (three) times daily.     amLODipine 10 MG tablet  Commonly known as:  NORVASC  Take 10 mg by mouth daily.     aspirin 81 MG chewable tablet  Chew 81 mg by mouth daily.     cholecalciferol 1000 UNITS tablet  Commonly known as:  VITAMIN D  Take 1,000 Units by mouth daily.     ciprofloxacin 500 MG tablet  Commonly known as:  CIPRO  Take 500 mg by mouth 2 (two) times daily.     divalproex 125 MG capsule  Commonly known as:  DEPAKOTE SPRINKLE  Take 2 capsules (250 mg total) by mouth 2 (two) times daily.     donepezil 10 MG disintegrating tablet  Commonly known as:  ARICEPT ODT  Take 10 mg by mouth at bedtime.     ferrous  sulfate 220 (44 FE) MG/5ML solution  Take 330 mg by mouth 2 (two) times daily.     senna-docusate 8.6-50 MG per tablet  Commonly known as:  SENOKOT S  Take 1 tablet by mouth at bedtime.     sertraline 50 MG tablet  Commonly known as:  ZOLOFT  Take 1 tablet (50 mg total) by mouth daily.        Meds ordered this encounter  Medications  . ciprofloxacin (CIPRO) 500 MG tablet    Sig: Take 500 mg by mouth 2 (two) times daily.  Marland Kitchen amLODipine (NORVASC) 10 MG tablet    Sig: Take 10 mg by mouth daily.    Immunization History  Administered Date(s) Administered  . PPD Test 08/30/2011  . Tdap 08/27/2011    History  Substance Use Topics  . Smoking status: Former Smoker    Types: Cigarettes  . Smokeless tobacco: Never Used  . Alcohol Use: No    Family history is noncontributory    Review of Systems - unobtainable sec to dementia   Filed Vitals:   09/10/13 2048  BP: 159/74  Pulse: 62  Temp: 97.4 F (36.3 C)  Resp: 20    Physical Exam  GENERAL APPEARANCE: minimally conversant. Marland Kitchen No  acute distress.  SKIN: No diaphoresis rash, or wounds HEAD: Normocephalic, atraumatic  EYES: Conjunctiva/lids clear. Pupils round, reactive. EOMs intact.  EARS: External exam WNL, canals clear. Hearing grossly normal.  NOSE: No deformity or discharge.  MOUTH/THROAT: Lips w/o lesions.  RESPIRATORY: Breathing is even, unlabored. Lung sounds are clear   CARDIOVASCULAR: Heart RRR no murmurs, rubs or gallops. No peripheral edema.  GASTROINTESTINAL: Abdomen is soft, non-tender, not distended w/ normal bowel sounds GENITOURINARY: Bladder non tender, not distended  MUSCULOSKELETAL: No abnormal joints or musculature NEUROLOGIC: Oriented X 1. Cranial nerves 2-12 grossly intact. Moves all extremities no tremor. PSYCHIATRIC:Dementia, no behavioral issues  Patient Active Problem List   Diagnosis Date Noted  . Hypernatremia 09/10/2013  . UTI (urinary tract infection) 09/10/2013  . Unspecified  constipation 01/21/2013  . Depression 01/20/2013  . CVA (cerebral infarction) 01/20/2013  . Hypokalemia 08/30/2011  . Microcytic anemia 08/28/2011  . Syncope 08/27/2011  . Dementia arising in the senium and presenium 08/27/2011  . HTN (hypertension) 08/27/2011  . Diabetes mellitus 08/27/2011  . Seizure disorder 08/27/2011    CBC    Component Value Date/Time   WBC 7.9 01/20/2013 2116   RBC 4.38 01/20/2013 2116   RBC 4.25 11/05/2009 2050   HGB 8.7* 01/20/2013 2116   HCT 28.7* 01/20/2013 2116   PLT 411* 01/20/2013 2116   MCV 65.5* 01/20/2013 2116   LYMPHSABS 5.2* 01/20/2013 1459   MONOABS 0.6 01/20/2013 1459   EOSABS 0.3 01/20/2013 1459   BASOSABS 0.2* 01/20/2013 1459    CMP     Component Value Date/Time   NA 143 01/20/2013 1507   K 4.1 01/20/2013 1507   CL 107 01/20/2013 1507   CO2 24 01/20/2013 1459   GLUCOSE 126* 01/20/2013 1507   BUN 22 01/20/2013 1507   CREATININE 0.60 01/20/2013 2116   CALCIUM 9.4 01/20/2013 1459   PROT 7.0 01/20/2013 1459   ALBUMIN 3.6 01/20/2013 1459   AST 21 01/20/2013 1459   ALT 10 01/20/2013 1459   ALKPHOS 90 01/20/2013 1459   BILITOT 0.2* 01/20/2013 1459   GFRNONAA 86* 01/20/2013 2116   GFRAA >90 01/20/2013 2116    Assessment and Plan  Hypernatremia Secondary to poor po intake from dementia; family discussion at hospital-do not want PEG tube and do understand that this problem could recur.  UTI (urinary tract infection) With e coli; cipro sensitive which needs to be given until 11/20  HTN (hypertension) Bp meds were adjusted at hospital;BP a little high today;will watch and see if need to add something else  Diabetes mellitus Pt has had poor po intake, all DM meds stopped;HbA1c 8 months ago was 6.3, don't have labs from recent hosp  Dementia arising in the senium and presenium Continue aricept   Seizure disorder Continue depakote  Depression Will continue zoloft    Margit Hanks, MD

## 2013-09-10 NOTE — Assessment & Plan Note (Signed)
Pt has had poor po intake, all DM meds stopped;HbA1c 8 months ago was 6.3, don't have labs from recent hosp

## 2013-09-10 NOTE — Assessment & Plan Note (Addendum)
Continue aricept 

## 2013-09-10 NOTE — Assessment & Plan Note (Signed)
Continue depakote 

## 2013-09-10 NOTE — Assessment & Plan Note (Signed)
Will continue zoloft

## 2013-09-24 ENCOUNTER — Encounter: Payer: Self-pay | Admitting: Internal Medicine

## 2013-09-24 ENCOUNTER — Non-Acute Institutional Stay (SKILLED_NURSING_FACILITY): Payer: Medicare Other | Admitting: Internal Medicine

## 2013-09-24 DIAGNOSIS — E876 Hypokalemia: Secondary | ICD-10-CM

## 2013-09-24 DIAGNOSIS — E119 Type 2 diabetes mellitus without complications: Secondary | ICD-10-CM

## 2013-09-24 DIAGNOSIS — I10 Essential (primary) hypertension: Secondary | ICD-10-CM

## 2013-09-24 DIAGNOSIS — E870 Hyperosmolality and hypernatremia: Secondary | ICD-10-CM

## 2013-09-24 DIAGNOSIS — D509 Iron deficiency anemia, unspecified: Secondary | ICD-10-CM

## 2013-09-24 NOTE — Assessment & Plan Note (Signed)
Pt's BP is 130-140's- a little high but to get better control would have to add a second pill which I don't want to do

## 2013-09-24 NOTE — Assessment & Plan Note (Signed)
See above-will check BMP

## 2013-09-24 NOTE — Progress Notes (Signed)
MRN: [id number] Name: Sheryl Jones  Sex: female Age: 79 y.o. DOB: 1934-10-07  PSC #: [facility] Facility/Room: 302A Level Of Care: SNF Provider: Merrilee Seashore D Emergency Contacts: Extended Emergency Contact Information Primary Emergency Contact: Jolene Provost of [place] Home Phone: [phone] Relation: Daughter Secondary Emergency Contact: Anyeli, [place], [place] Home Phone: [phone] Relation: Other  Code Status: DNR  Allergies: Review of patient's allergies indicates no known allergies.  Chief Complaint  Patient presents with  . Medical Managment of Chronic Issues    HPI: Patient is 79 y.o. female who seen for routine and also to check out a raised area reported on back.  Past Medical History  Diagnosis Date  . Dementia   . Hypertension   . Diabetes mellitus   . Iron deficiency anemia   . Hypokalemia   . Depressive disorder   . Anxiety   . Unspecified epilepsy without mention of intractable epilepsy   . Other and unspecified hyperlipidemia   . Colon polyp     No past surgical history on file.    Medication List       This list is accurate as of: 09/24/13  4:21 PM.  Always use your most recent med list.               acetaminophen 500 MG tablet  Commonly known as:  TYLENOL  Take 1,000 mg by mouth 3 (three) times daily.     amLODipine 10 MG tablet  Commonly known as:  NORVASC  Take 10 mg by mouth daily.     aspirin 81 MG chewable tablet  Chew 81 mg by mouth daily.     cholecalciferol 1000 UNITS tablet  Commonly known as:  VITAMIN D  Take 1,000 Units by mouth daily.     divalproex 125 MG capsule  Commonly known as:  DEPAKOTE SPRINKLE  Take 2 capsules (250 mg total) by mouth 2 (two) times daily.     donepezil 10 MG disintegrating tablet  Commonly known as:  ARICEPT ODT  Take 10 mg by mouth at bedtime.     ferrous sulfate 220 (44 FE) MG/5ML solution  Take 330 mg by mouth 2 (two) times daily.     senna-docusate 8.6-50 MG per tablet  Commonly known as:  SENOKOT S  Take 1 tablet by mouth at bedtime.     sertraline 50 MG tablet  Commonly known as:  ZOLOFT  Take 1 tablet (50 mg total) by mouth daily.        No orders of the defined types were placed in this encounter.    Immunization History  Administered Date(s) Administered  . PPD Test 08/30/2011  . Tdap 08/27/2011    History  Substance Use Topics  . Smoking status: Former Smoker    Types: Cigarettes  . Smokeless tobacco: Never Used  . Alcohol Use: No    Review of Systems- cannot from pt-dementia-no problems per nurses except as in HPI     Filed Vitals:   09/24/13 1612  BP: 140/77  Pulse: 83  Temp: 99.3 F (37.4 C)  Resp: 20    Physical Exam  GENERAL APPEARANCE: trying to sleep, non conversant. Appropriately groomed. No acute distress  SKIN: No diaphoresis rash;no raised area on back noted HEENT: Unremarkable RESPIRATORY: Breathing is even, unlabored. Lung sounds are clear   CARDIOVASCULAR: Heart RRR no murmurs, rubs or gallops. No peripheral edema  GASTROINTESTINAL: Abdomen is soft, non-tender, not distended w/  normal bowel sounds.  GENITOURINARY: Bladder non tender, not distended  MUSCULOSKELETAL: No abnormal joints or musculature; midline scar in back, old NEUROLOGIC: Cranial nerves 2-12 grossly intact. Moves all extremities no tremor. PSYCHIATRIC: dementia, sleepy, trying to ignore me; no behavioral issues  Patient Active Problem List   Diagnosis Date Noted  . Hypernatremia 09/10/2013  . UTI (urinary tract infection) 09/10/2013  . Unspecified constipation 01/21/2013  . Depression 01/20/2013  . CVA (cerebral infarction) 01/20/2013  . Hypokalemia 08/30/2011  . Microcytic anemia 08/28/2011  . Syncope 08/27/2011  . Dementia arising in the senium and presenium 08/27/2011  . HTN (hypertension) 08/27/2011  . Diabetes mellitus 08/27/2011  . Seizure disorder 08/27/2011    CBC    Component Value  Date/Time   WBC 7.9 01/20/2013 2116   RBC 4.38 01/20/2013 2116   RBC 4.25 11/05/2009 2050   HGB 8.7* 01/20/2013 2116   HCT 28.7* 01/20/2013 2116   PLT 411* 01/20/2013 2116   MCV 65.5* 01/20/2013 2116   LYMPHSABS 5.2* 01/20/2013 1459   MONOABS 0.6 01/20/2013 1459   EOSABS 0.3 01/20/2013 1459   BASOSABS 0.2* 01/20/2013 1459    CMP     Component Value Date/Time   NA 143 01/20/2013 1507   K 4.1 01/20/2013 1507   CL 107 01/20/2013 1507   CO2 24 01/20/2013 1459   GLUCOSE 126* 01/20/2013 1507   BUN 22 01/20/2013 1507   CREATININE 0.60 01/20/2013 2116   CALCIUM 9.4 01/20/2013 1459   PROT 7.0 01/20/2013 1459   ALBUMIN 3.6 01/20/2013 1459   AST 21 01/20/2013 1459   ALT 10 01/20/2013 1459   ALKPHOS 90 01/20/2013 1459   BILITOT 0.2* 01/20/2013 1459   GFRNONAA 86* 01/20/2013 2116   GFRAA >90 01/20/2013 2116    Assessment and Plan  Hypernatremia Will check BMP to look at Na and K  Hypokalemia See above-will check BMP  Microcytic anemia dont have a recent HB- will check CBC- pt is on iron supplement  Diabetes mellitus Will check a HbA1c - suspect it will be in the 6's as before but we have no documentation  HTN (hypertension) Pt's BP is 130-140's- a little high but to get better control would have to add a second pill which I don't want to do    Margit Hanks, MD

## 2013-09-24 NOTE — Assessment & Plan Note (Signed)
Will check a HbA1c - suspect it will be in the 6's as before but we have no documentation

## 2013-09-24 NOTE — Assessment & Plan Note (Signed)
dont have a recent HB- will check CBC- pt is on iron supplement

## 2013-09-24 NOTE — Assessment & Plan Note (Signed)
Will check BMP to look at Na and K

## 2013-10-29 ENCOUNTER — Encounter (HOSPITAL_COMMUNITY): Payer: Self-pay | Admitting: Emergency Medicine

## 2013-10-29 ENCOUNTER — Emergency Department (HOSPITAL_COMMUNITY): Payer: Medicare Other

## 2013-10-29 ENCOUNTER — Inpatient Hospital Stay (HOSPITAL_COMMUNITY)
Admission: EM | Admit: 2013-10-29 | Discharge: 2013-11-03 | DRG: 299 | Disposition: A | Discharge status: Skilled Nursing Facility | Payer: Medicare Other | Attending: Internal Medicine | Admitting: Internal Medicine

## 2013-10-29 DIAGNOSIS — E1169 Type 2 diabetes mellitus with other specified complication: Secondary | ICD-10-CM | POA: Diagnosis present

## 2013-10-29 DIAGNOSIS — F329 Major depressive disorder, single episode, unspecified: Secondary | ICD-10-CM | POA: Diagnosis present

## 2013-10-29 DIAGNOSIS — I10 Essential (primary) hypertension: Secondary | ICD-10-CM | POA: Diagnosis present

## 2013-10-29 DIAGNOSIS — E042 Nontoxic multinodular goiter: Secondary | ICD-10-CM | POA: Diagnosis present

## 2013-10-29 DIAGNOSIS — M79604 Pain in right leg: Secondary | ICD-10-CM

## 2013-10-29 DIAGNOSIS — D509 Iron deficiency anemia, unspecified: Secondary | ICD-10-CM

## 2013-10-29 DIAGNOSIS — Z87891 Personal history of nicotine dependence: Secondary | ICD-10-CM

## 2013-10-29 DIAGNOSIS — I824Y9 Acute embolism and thrombosis of unspecified deep veins of unspecified proximal lower extremity: Principal | ICD-10-CM | POA: Diagnosis present

## 2013-10-29 DIAGNOSIS — R609 Edema, unspecified: Secondary | ICD-10-CM | POA: Diagnosis present

## 2013-10-29 DIAGNOSIS — I639 Cerebral infarction, unspecified: Secondary | ICD-10-CM

## 2013-10-29 DIAGNOSIS — M810 Age-related osteoporosis without current pathological fracture: Secondary | ICD-10-CM | POA: Diagnosis present

## 2013-10-29 DIAGNOSIS — Z803 Family history of malignant neoplasm of breast: Secondary | ICD-10-CM

## 2013-10-29 DIAGNOSIS — Z79899 Other long term (current) drug therapy: Secondary | ICD-10-CM

## 2013-10-29 DIAGNOSIS — Z8673 Personal history of transient ischemic attack (TIA), and cerebral infarction without residual deficits: Secondary | ICD-10-CM

## 2013-10-29 DIAGNOSIS — N390 Urinary tract infection, site not specified: Secondary | ICD-10-CM

## 2013-10-29 DIAGNOSIS — IMO0001 Reserved for inherently not codable concepts without codable children: Secondary | ICD-10-CM | POA: Diagnosis present

## 2013-10-29 DIAGNOSIS — F32A Depression, unspecified: Secondary | ICD-10-CM

## 2013-10-29 DIAGNOSIS — F3289 Other specified depressive episodes: Secondary | ICD-10-CM | POA: Diagnosis present

## 2013-10-29 DIAGNOSIS — Z66 Do not resuscitate: Secondary | ICD-10-CM | POA: Diagnosis present

## 2013-10-29 DIAGNOSIS — E119 Type 2 diabetes mellitus without complications: Secondary | ICD-10-CM

## 2013-10-29 DIAGNOSIS — R1311 Dysphagia, oral phase: Secondary | ICD-10-CM | POA: Diagnosis present

## 2013-10-29 DIAGNOSIS — Z6826 Body mass index (BMI) 26.0-26.9, adult: Secondary | ICD-10-CM

## 2013-10-29 DIAGNOSIS — R933 Abnormal findings on diagnostic imaging of other parts of digestive tract: Secondary | ICD-10-CM

## 2013-10-29 DIAGNOSIS — F411 Generalized anxiety disorder: Secondary | ICD-10-CM | POA: Diagnosis present

## 2013-10-29 DIAGNOSIS — K5900 Constipation, unspecified: Secondary | ICD-10-CM

## 2013-10-29 DIAGNOSIS — M1990 Unspecified osteoarthritis, unspecified site: Secondary | ICD-10-CM | POA: Diagnosis present

## 2013-10-29 DIAGNOSIS — F0390 Unspecified dementia without behavioral disturbance: Secondary | ICD-10-CM | POA: Diagnosis present

## 2013-10-29 DIAGNOSIS — K2990 Gastroduodenitis, unspecified, without bleeding: Secondary | ICD-10-CM

## 2013-10-29 DIAGNOSIS — I6990 Unspecified sequelae of unspecified cerebrovascular disease: Secondary | ICD-10-CM | POA: Diagnosis present

## 2013-10-29 DIAGNOSIS — K228 Other specified diseases of esophagus: Secondary | ICD-10-CM

## 2013-10-29 DIAGNOSIS — E43 Unspecified severe protein-calorie malnutrition: Secondary | ICD-10-CM | POA: Insufficient documentation

## 2013-10-29 DIAGNOSIS — K2970 Gastritis, unspecified, without bleeding: Secondary | ICD-10-CM

## 2013-10-29 DIAGNOSIS — Z7982 Long term (current) use of aspirin: Secondary | ICD-10-CM

## 2013-10-29 DIAGNOSIS — K2289 Other specified disease of esophagus: Secondary | ICD-10-CM | POA: Diagnosis present

## 2013-10-29 DIAGNOSIS — E1165 Type 2 diabetes mellitus with hyperglycemia: Secondary | ICD-10-CM

## 2013-10-29 DIAGNOSIS — I82419 Acute embolism and thrombosis of unspecified femoral vein: Secondary | ICD-10-CM | POA: Diagnosis present

## 2013-10-29 DIAGNOSIS — E785 Hyperlipidemia, unspecified: Secondary | ICD-10-CM | POA: Diagnosis present

## 2013-10-29 DIAGNOSIS — Z993 Dependence on wheelchair: Secondary | ICD-10-CM

## 2013-10-29 DIAGNOSIS — E870 Hyperosmolality and hypernatremia: Secondary | ICD-10-CM

## 2013-10-29 DIAGNOSIS — R55 Syncope and collapse: Secondary | ICD-10-CM | POA: Diagnosis present

## 2013-10-29 DIAGNOSIS — I6930 Unspecified sequelae of cerebral infarction: Secondary | ICD-10-CM | POA: Diagnosis present

## 2013-10-29 DIAGNOSIS — Z8249 Family history of ischemic heart disease and other diseases of the circulatory system: Secondary | ICD-10-CM

## 2013-10-29 DIAGNOSIS — E876 Hypokalemia: Secondary | ICD-10-CM

## 2013-10-29 DIAGNOSIS — M7989 Other specified soft tissue disorders: Secondary | ICD-10-CM | POA: Diagnosis present

## 2013-10-29 DIAGNOSIS — G40909 Epilepsy, unspecified, not intractable, without status epilepticus: Secondary | ICD-10-CM | POA: Diagnosis present

## 2013-10-29 DIAGNOSIS — R600 Localized edema: Secondary | ICD-10-CM | POA: Diagnosis present

## 2013-10-29 LAB — URINALYSIS, ROUTINE W REFLEX MICROSCOPIC
GLUCOSE, UA: NEGATIVE mg/dL
HGB URINE DIPSTICK: NEGATIVE
Ketones, ur: 15 mg/dL — AB
LEUKOCYTES UA: NEGATIVE
Nitrite: NEGATIVE
PROTEIN: 30 mg/dL — AB
SPECIFIC GRAVITY, URINE: 1.031 — AB (ref 1.005–1.030)
UROBILINOGEN UA: 1 mg/dL (ref 0.0–1.0)
pH: 5.5 (ref 5.0–8.0)

## 2013-10-29 LAB — CBC WITH DIFFERENTIAL/PLATELET
BASOS PCT: 1 % (ref 0–1)
Basophils Absolute: 0.1 10*3/uL (ref 0.0–0.1)
EOS PCT: 3 % (ref 0–5)
Eosinophils Absolute: 0.2 10*3/uL (ref 0.0–0.7)
HCT: 32.8 % — ABNORMAL LOW (ref 36.0–46.0)
HEMOGLOBIN: 10 g/dL — AB (ref 12.0–15.0)
LYMPHS PCT: 38 % (ref 12–46)
Lymphs Abs: 2.2 10*3/uL (ref 0.7–4.0)
MCH: 24.1 pg — ABNORMAL LOW (ref 26.0–34.0)
MCHC: 30.5 g/dL (ref 30.0–36.0)
MCV: 79 fL (ref 78.0–100.0)
MONOS PCT: 11 % (ref 3–12)
Monocytes Absolute: 0.6 10*3/uL (ref 0.1–1.0)
NEUTROS PCT: 47 % (ref 43–77)
Neutro Abs: 2.6 10*3/uL (ref 1.7–7.7)
PLATELETS: 378 10*3/uL (ref 150–400)
RBC: 4.15 MIL/uL (ref 3.87–5.11)
RDW: 22.4 % — ABNORMAL HIGH (ref 11.5–15.5)
WBC: 5.7 10*3/uL (ref 4.0–10.5)

## 2013-10-29 LAB — BASIC METABOLIC PANEL
BUN: 14 mg/dL (ref 6–23)
CALCIUM: 9.2 mg/dL (ref 8.4–10.5)
CO2: 29 meq/L (ref 19–32)
Chloride: 102 mEq/L (ref 96–112)
Creatinine, Ser: 0.64 mg/dL (ref 0.50–1.10)
GFR calc Af Amer: >90 mL/min (ref >90)
GFR calc non Af Amer: 84 mL/min — ABNORMAL LOW (ref >90)
GLUCOSE: 141 mg/dL — AB (ref 70–99)
POTASSIUM: 3.7 meq/L (ref 3.7–5.3)
SODIUM: 147 meq/L (ref 137–147)

## 2013-10-29 LAB — D-DIMER, QUANTITATIVE: D-Dimer, Quant: 2.41 ug/mL-FEU — ABNORMAL HIGH (ref 0.00–0.48)

## 2013-10-29 LAB — URINE MICROSCOPIC-ADD ON

## 2013-10-29 LAB — CG4 I-STAT (LACTIC ACID): Lactic Acid, Venous: 2.21 mmol/L — ABNORMAL HIGH (ref 0.5–2.2)

## 2013-10-29 LAB — POCT I-STAT TROPONIN I: TROPONIN I, POC: 0 ng/mL (ref 0.00–0.08)

## 2013-10-29 NOTE — ED Provider Notes (Signed)
CSN: [id number]     Arrival date & time 10/29/13  1909 History   First MD Initiated Contact with Patient 10/29/13 1931     Chief Complaint  Patient presents with  . Altered Mental Status  . unresponsive    (Consider location/radiation/quality/duration/timing/severity/associated sxs/prior Treatment) HPI DNR DNI severe dementia at baseline wheelchair-bound nonverbal does not follow commands at baseline now has has one to 2 weeks of swelling and pain right thigh knee lower leg no history of DVT today while sitting in wheelchair became unresponsive for 15 minutes fell out of wheelchair might have hit head no apparent new focal weakness no difficulty breathing still has swollen tender right leg   Past Medical History  Diagnosis Date  . Dementia   . Hypertension   . Diabetes mellitus   . Iron deficiency anemia   . Hypokalemia   . Depressive disorder   . Anxiety   . Unspecified epilepsy without mention of intractable epilepsy   . Other and unspecified hyperlipidemia   . Colon polyp    History reviewed. No pertinent past surgical history. Family History  Problem Relation Age of Onset  . Heart attack Mother   . Heart attack Father   . Breast cancer Sister     meta  . Hypertension Father   . Hypertension Daughter    History  Substance Use Topics  . Smoking status: Former Smoker    Types: Cigarettes  . Smokeless tobacco: Never Used  . Alcohol Use: No   OB History   Grav Para Term Preterm Abortions TAB SAB Ect Mult Living                 Review of Systems  Unable to perform ROS: Dementia    Allergies  Review of patient's allergies indicates no known allergies.  Home Medications   Current Outpatient Rx  Name  Route  Sig  Dispense  Refill  . acetaminophen (TYLENOL) 500 MG tablet   Oral   Take 1,000 mg by mouth every 8 (eight) hours as needed for mild pain.          Marland Kitchen amLODipine (NORVASC) 10 MG tablet   Oral   Take 10 mg by mouth daily.         Marland Kitchen aspirin 81 MG  chewable tablet   Oral   Chew 81 mg by mouth daily.         . Cholecalciferol (VITAMIN D-3) 1000 UNITS CAPS   Oral   Take 1 capsule by mouth daily.         . divalproex (DEPAKOTE) 250 MG DR tablet   Oral   Take 250 mg by mouth 2 (two) times daily.         Marland Kitchen donepezil (ARICEPT ODT) 10 MG disintegrating tablet   Oral   Take 10 mg by mouth at bedtime.         . ferrous fumarate (HEMOCYTE - 106 MG FE) 325 (106 FE) MG TABS tablet   Oral   Take 1 tablet by mouth daily.         . Menthol, Topical Analgesic, (BENGAY EX)   Apply externally   Apply 1 application topically 2 (two) times daily as needed. For knee pain         . senna-docusate (SENOKOT S) 8.6-50 MG per tablet   Oral   Take 1 tablet by mouth at bedtime.         . sertraline (ZOLOFT) 100 MG tablet   Oral  Take 100 mg by mouth daily.          BP 114/75  Pulse 64  Temp(Src) 97.6 F (36.4 C) (Oral)  Resp 10  SpO2 100% Physical Exam  Nursing note and vitals reviewed. Constitutional:  Awake, eyes closed, nonverbal, bedridden (baseline for Pt)  HENT:  Head: Atraumatic.  Eyes: Right eye exhibits no discharge. Left eye exhibits no discharge.  Eyes closed  Neck: Neck supple.  Cardiovascular: Normal rate and regular rhythm.   No murmur heard. Pulmonary/Chest: Effort normal and breath sounds normal. No respiratory distress. She has no wheezes. She has no rales. She exhibits no tenderness.  Abdominal: Soft. There is no tenderness. There is no rebound.  Musculoskeletal: She exhibits edema. She exhibits no tenderness.  Baseline ROM, no obvious new focal weakness.Eyes closed nonverbal and does not follow commands which are baseline; moves all 4 extremities slightly spontaneously which is baseline; trace edema left leg; mild edema right leg mild tenderness swelling and slightly increased warmth right thigh knee lower leg both feet have dorsalis pedis pulses intact with capillary refill less than 2 seconds, no  redness or rash or induration to right leg  Neurological:  Mental status and motor strength appears baseline for patient and situation.Eyes closed nonverbal and does not follow commands which are baseline; moves all 4 extremities slightly spontaneously which is baseline  Skin: No rash noted.  Psychiatric: She has a normal mood and affect.    ED Course  Procedures (including critical care time) Family / Caregiver understand and agree with initial ED impression and plan with expectations set for ED visit. Care endorsed to PA Dispo pending, anticipate Obs. Labs Review Labs Reviewed  CBC WITH DIFFERENTIAL - Abnormal; Notable for the following:    Hemoglobin 10.0 (*)    HCT 32.8 (*)    MCH 24.1 (*)    RDW 22.4 (*)    All other components within normal limits  BASIC METABOLIC PANEL - Abnormal; Notable for the following:    Glucose, Bld 141 (*)    GFR calc non Af Amer 84 (*)    All other components within normal limits  URINALYSIS, ROUTINE W REFLEX MICROSCOPIC - Abnormal; Notable for the following:    Color, Urine AMBER (*)    APPearance CLOUDY (*)    Specific Gravity, Urine 1.031 (*)    Bilirubin Urine SMALL (*)    Ketones, ur 15 (*)    Protein, ur 30 (*)    All other components within normal limits  D-DIMER, QUANTITATIVE - Abnormal; Notable for the following:    D-Dimer, Quant 2.41 (*)    All other components within normal limits  URINE MICROSCOPIC-ADD ON - Abnormal; Notable for the following:    Bacteria, UA MANY (*)    Crystals CA OXALATE CRYSTALS (*)    All other components within normal limits  CG4 I-STAT (LACTIC ACID) - Abnormal; Notable for the following:    Lactic Acid, Venous 2.21 (*)    All other components within normal limits  POCT I-STAT TROPONIN I   Imaging Review Dg Chest 1 View  10/29/2013   CLINICAL DATA:  Dementia  EXAM: CHEST - 1 VIEW  COMPARISON:  09/01/2013  FINDINGS: Normal heart size.  Clear lungs.  No pneumothorax.  IMPRESSION: No active cardiopulmonary  disease.   Electronically Signed   By: Maryclare Bean M.D.   On: 10/29/2013 21:52   Dg Hip Complete Left  10/29/2013   CLINICAL DATA:  Pain post trauma  EXAM:  LEFT HIP - COMPLETE 2+ VIEW  COMPARISON:  None.  FINDINGS: Frontal pelvis as well as frontal and lateral left hip joint images were obtained. There is moderate symmetric narrowing of both hip joints. No fracture or dislocation. No erosive change. Bones are osteoporotic.  IMPRESSION: Osteoarthritic change in both hip joints. Bones osteoporotic. No acute fracture or dislocation.   Electronically Signed   By: Bretta Bang M.D.   On: 10/29/2013 21:56   Dg Femur Right  10/29/2013   CLINICAL DATA:  Pain post trauma  EXAM: RIGHT FEMUR - 2 VIEW  COMPARISON:  None.  FINDINGS: Frontal and lateral views were obtained. There is no fracture or dislocation. There is a moderate knee joint effusion. There is arthropathy in the right knee joint region. There is mild narrowing of the left hip joint.  IMPRESSION: Moderate knee joint effusion. Osteoarthritic change in the right knee and to a lesser extent right hip joints. No fracture or dislocation apparent.   Electronically Signed   By: Bretta Bang M.D.   On: 10/29/2013 21:53   Ct Cervical Spine Wo Contrast  10/29/2013   CLINICAL DATA:  Altered mental status; neck pain  EXAM: CT HEAD WITHOUT CONTRAST  CT CERVICAL SPINE WITHOUT CONTRAST  TECHNIQUE: Multidetector CT imaging of the head and cervical spine was performed following the standard protocol without intravenous contrast. Multiplanar CT image reconstructions of the cervical spine were also generated. Study was obtained within 24 hr of patient's arrival at the emergency department.  COMPARISON:  Brain CT September 01, 2013  FINDINGS: CT HEAD FINDINGS  There is moderately severe diffuse atrophy. The ventricles are quite prominent, raising question of underlying communicating hydrocephalus.  There is no demonstrable mass, hemorrhage, extra-axial fluid collection,  or midline shift. There is a small prior infarct in the mid left cerebellum, stable. There is mild small vessel disease in the centra semiovale bilaterally. There is no new gray-white compartment lesion. No demonstrable acute infarct.  The bony calvarium appears intact. The mastoid air cells are clear. There is debris in the right external auditory canal.  CT CERVICAL SPINE FINDINGS  There is a mild degree of motion artifact. There is no demonstrable fracture or spondylolisthesis. Prevertebral soft tissues and predental space regions are normal.  There is moderate disc space narrowing at all levels except for C2-3. There are prominent anterior osteophytes at all levels except for C2. There is moderate facet osteoarthritic change at all levels without nerve root edema or effacement appreciable. No disc extrusion or stenosis apparent. Note that there is localized calcification in the posterior longitudinal ligament at C3-4 causing mild impression on the ventral cord.  The thyroid has an inhomogeneous appearance. 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  IMPRESSION: CT head: Extensive atrophy. Question a degree of concomitant communicating hydrocephalus. There is mild small vessel disease in the centra semiovale bilaterally as well as a small prior infarct in the mid left cerebellum. There is no intracranial mass or hemorrhage. No acute appearing infarct. There is debris in the external auditory canal on the right; question cerumen impaction.  CT cervical spine: Multilevel spondylosis and osteoarthritis. No disc extrusion or stenosis. Calcification in the posterior longitudinal ligament at C3-4 causes mild impression on the ventral cord without frank stenosis. No fracture or spondylolisthesis.  Hypoplastic left lobe of thyroid. Inhomogeneity in the right lobe; this finding may warrant nonemergent thyroid ultrasound to further assess.   Electronically Signed   By: Bretta Bang M.D.   On: 10/29/2013 22:10  Dg Knee Complete  4 Views Right  10/29/2013   CLINICAL DATA:  Pain post trauma  EXAM: RIGHT KNEE - COMPLETE 4+ VIEW  COMPARISON:  None.  FINDINGS: Frontal, lateral, and bilateral oblique views were obtained. There is no demonstrable fracture or dislocation. There is a moderate knee joint effusion. There is moderate generalized osteoarthritic change, most marked medially and in the patellofemoral joint regions. Bones are osteoporotic.  IMPRESSION: Moderate joint effusion. No acute fracture or dislocation. Osteoarthritic change with narrowing rather marked medially and in the patellofemoral joint regions.   Electronically Signed   By: Bretta BangWilliam  Woodruff M.D.   On: 10/29/2013 21:54    EKG Interpretation   None       MDM   1. Syncope   2. Right leg pain   3. Dementia    Dispo pending.    Hurman HornJohn M Eulogio Requena, MD 10/30/13 [phone]

## 2013-10-29 NOTE — ED Provider Notes (Signed)
D-dimer elevated.  Cr w/in nml range.  CT angio chest ordered to r/o PE.  Patient's daughters aware of all test results as well as plan.  Pt stable. 11:31 PM   Otilio Miuatherine E Maxximus Gotay, PA-C 10/29/13 [phone]

## 2013-10-29 NOTE — ED Notes (Signed)
Pt family member states right knee has been swollen for +2 weeks; asked facility nurse about right knee and nurse stated that doctor would take care of it.

## 2013-10-29 NOTE — ED Notes (Signed)
[facility] 80 [facility] and was said to be unresponsive by nursing staff.  When [facility] arrived pt was responsive to stimuli and progressively awake.  Hx of seizure and dementia (depressive disorder).  VS normal; CBG 110

## 2013-10-30 ENCOUNTER — Encounter (HOSPITAL_COMMUNITY): Payer: Self-pay | Admitting: Radiology

## 2013-10-30 ENCOUNTER — Emergency Department (HOSPITAL_COMMUNITY): Payer: Medicare Other

## 2013-10-30 ENCOUNTER — Inpatient Hospital Stay (HOSPITAL_COMMUNITY): Payer: Medicare Other

## 2013-10-30 DIAGNOSIS — R609 Edema, unspecified: Secondary | ICD-10-CM

## 2013-10-30 DIAGNOSIS — M7989 Other specified soft tissue disorders: Secondary | ICD-10-CM

## 2013-10-30 DIAGNOSIS — R55 Syncope and collapse: Secondary | ICD-10-CM | POA: Diagnosis present

## 2013-10-30 DIAGNOSIS — R600 Localized edema: Secondary | ICD-10-CM | POA: Diagnosis present

## 2013-10-30 DIAGNOSIS — R933 Abnormal findings on diagnostic imaging of other parts of digestive tract: Secondary | ICD-10-CM

## 2013-10-30 DIAGNOSIS — I824Y9 Acute embolism and thrombosis of unspecified deep veins of unspecified proximal lower extremity: Principal | ICD-10-CM

## 2013-10-30 DIAGNOSIS — K228 Other specified diseases of esophagus: Secondary | ICD-10-CM | POA: Diagnosis present

## 2013-10-30 DIAGNOSIS — M79609 Pain in unspecified limb: Secondary | ICD-10-CM

## 2013-10-30 DIAGNOSIS — E43 Unspecified severe protein-calorie malnutrition: Secondary | ICD-10-CM | POA: Insufficient documentation

## 2013-10-30 DIAGNOSIS — F0390 Unspecified dementia without behavioral disturbance: Secondary | ICD-10-CM

## 2013-10-30 DIAGNOSIS — I10 Essential (primary) hypertension: Secondary | ICD-10-CM

## 2013-10-30 DIAGNOSIS — E119 Type 2 diabetes mellitus without complications: Secondary | ICD-10-CM

## 2013-10-30 DIAGNOSIS — N390 Urinary tract infection, site not specified: Secondary | ICD-10-CM

## 2013-10-30 DIAGNOSIS — K2289 Other specified disease of esophagus: Secondary | ICD-10-CM | POA: Diagnosis present

## 2013-10-30 DIAGNOSIS — I82419 Acute embolism and thrombosis of unspecified femoral vein: Secondary | ICD-10-CM | POA: Diagnosis present

## 2013-10-30 LAB — GLUCOSE, CAPILLARY
GLUCOSE-CAPILLARY: 113 mg/dL — AB (ref 70–99)
Glucose-Capillary: 84 mg/dL (ref 70–99)
Glucose-Capillary: 83 mg/dL (ref 70–99)
Glucose-Capillary: 117 mg/dL — ABNORMAL HIGH (ref 70–99)
Glucose-Capillary: 91 mg/dL (ref 70–99)

## 2013-10-30 LAB — TROPONIN I

## 2013-10-30 LAB — VALPROIC ACID LEVEL: Valproic Acid Lvl: 24.9 ug/mL — ABNORMAL LOW (ref 50.0–100.0)

## 2013-10-30 MED ORDER — INSULIN ASPART 100 UNIT/ML ~~LOC~~ SOLN: 0.0000 [IU] | SUBCUTANEOUS | Status: DC

## 2013-10-30 MED ORDER — VITAMIN D3 25 MCG (1000 UNIT) PO TABS
1000.0000 [IU] | ORAL_TABLET | Freq: Every day | ORAL | Status: DC
  Administered 2013-10-30 – 2013-11-03 (×5): 1000 [IU] via ORAL
  Filled 2013-10-30 (×5): qty 1

## 2013-10-30 MED ORDER — ENSURE COMPLETE PO LIQD
237.0000 mL | Freq: Two times a day (BID) | ORAL | Status: DC
  Administered 2013-10-30 – 2013-11-03 (×7): 237 mL via ORAL

## 2013-10-30 MED ORDER — DONEPEZIL HCL 10 MG PO TABS
10.0000 mg | ORAL_TABLET | Freq: Every day | ORAL | Status: DC
  Administered 2013-10-30 – 2013-11-02 (×4): 10 mg via ORAL
  Filled 2013-10-30 (×5): qty 1

## 2013-10-30 MED ORDER — SERTRALINE HCL 100 MG PO TABS
100.0000 mg | ORAL_TABLET | Freq: Every day | ORAL | Status: DC
  Administered 2013-10-30 – 2013-11-03 (×5): 100 mg via ORAL
  Filled 2013-10-30 (×5): qty 1

## 2013-10-30 MED ORDER — SENNOSIDES-DOCUSATE SODIUM 8.6-50 MG PO TABS
1.0000 | ORAL_TABLET | Freq: Every day | ORAL | Status: DC
  Administered 2013-10-30 – 2013-11-02 (×4): 1 via ORAL
  Filled 2013-10-30 (×6): qty 1

## 2013-10-30 MED ORDER — ACETAMINOPHEN 500 MG PO TABS: 1000.0000 mg | ORAL_TABLET | Freq: Three times a day (TID) | ORAL | Status: DC | PRN

## 2013-10-30 MED ORDER — ENOXAPARIN SODIUM 80 MG/0.8ML ~~LOC~~ SOLN
1.0000 mg/kg | Freq: Two times a day (BID) | SUBCUTANEOUS | Status: AC
  Administered 2013-10-30 (×2): 75 mg via SUBCUTANEOUS
  Filled 2013-10-30 (×3): qty 0.8

## 2013-10-30 MED ORDER — DIVALPROEX SODIUM 250 MG PO DR TAB
250.0000 mg | DELAYED_RELEASE_TABLET | Freq: Two times a day (BID) | ORAL | Status: DC
  Filled 2013-10-30 (×2): qty 1

## 2013-10-30 MED ORDER — DEXTROSE 5 % IV SOLN
1.0000 g | INTRAVENOUS | Status: DC
  Administered 2013-10-30 – 2013-11-02 (×4): 1 g via INTRAVENOUS
  Filled 2013-10-30 (×4): qty 10

## 2013-10-30 MED ORDER — DIVALPROEX SODIUM 500 MG PO DR TAB
500.0000 mg | DELAYED_RELEASE_TABLET | Freq: Two times a day (BID) | ORAL | Status: DC
  Administered 2013-10-30 – 2013-11-03 (×8): 500 mg via ORAL
  Filled 2013-10-30 (×10): qty 1

## 2013-10-30 MED ORDER — SODIUM CHLORIDE 0.9 % IJ SOLN
3.0000 mL | Freq: Two times a day (BID) | INTRAMUSCULAR | Status: DC
  Administered 2013-10-30 – 2013-11-02 (×6): 3 mL via INTRAVENOUS

## 2013-10-30 MED ORDER — ASPIRIN 81 MG PO CHEW
81.0000 mg | CHEWABLE_TABLET | Freq: Every day | ORAL | Status: DC
  Administered 2013-10-30 – 2013-11-03 (×5): 81 mg via ORAL
  Filled 2013-10-30 (×4): qty 1

## 2013-10-30 MED ORDER — DONEPEZIL HCL 10 MG PO TBDP: 10.0000 mg | ORAL_TABLET | Freq: Every day | ORAL | Status: DC

## 2013-10-30 MED ORDER — IOHEXOL 350 MG/ML SOLN
80.0000 mL | Freq: Once | INTRAVENOUS | Status: AC | PRN
  Administered 2013-10-30: 54 mL via INTRAVENOUS

## 2013-10-30 MED ORDER — AMLODIPINE BESYLATE 10 MG PO TABS
10.0000 mg | ORAL_TABLET | Freq: Every day | ORAL | Status: DC
  Administered 2013-10-30 – 2013-11-03 (×5): 10 mg via ORAL
  Filled 2013-10-30 (×5): qty 1

## 2013-10-30 MED ORDER — ENSURE PUDDING PO PUDG
1.0000 | Freq: Two times a day (BID) | ORAL | Status: DC
  Administered 2013-10-30 – 2013-11-03 (×6): 1 via ORAL

## 2013-10-30 MED ORDER — VITAMIN D-3 25 MCG (1000 UT) PO CAPS: 1.0000 | ORAL_CAPSULE | Freq: Every day | ORAL | Status: DC

## 2013-10-30 MED ORDER — FERROUS FUMARATE 325 (106 FE) MG PO TABS
1.0000 | ORAL_TABLET | Freq: Every day | ORAL | Status: DC
  Administered 2013-10-30: 106 mg via ORAL
  Filled 2013-10-30 (×2): qty 1

## 2013-10-30 NOTE — Progress Notes (Addendum)
MEDICATION RELATED CONSULT NOTE - INITIAL   Pharmacy Consult for Depakote Indication: seizure disorder  No Known Allergies  Patient Measurements: Height: 5\' 7"  (170.2 cm) Weight: 169 lb 4.8 oz (76.794 kg) IBW/kg (Calculated) : 61.6  Vital Signs: Temp: 98.6 F (37 C) (01/08 0418) Temp src: Oral (01/08 0418) BP: 148/81 mmHg (01/08 0418) Pulse Rate: 70 (01/08 0418) Intake/Output from previous day:   Intake/Output from this shift: Total I/O In: 240 [P.O.:240] Out: 152 [Urine:150; Stool:2]  Labs:  Recent Labs  10/29/13 2020  WBC 5.7  HGB 10.0*  HCT 32.8*  PLT 378  CREATININE 0.64   Valproic acid level 24.9  Estimated Creatinine Clearance: 62.9 ml/min (by C-G formula based on Cr of 0.64).   Microbiology: No results found for this or any previous visit (from the past 720 hour(s)).  Medical History: Past Medical History  Diagnosis Date  . Dementia   . Hypertension   . Diabetes mellitus   . Iron deficiency anemia   . Hypokalemia   . Depressive disorder   . Anxiety   . Unspecified epilepsy without mention of intractable epilepsy   . Other and unspecified hyperlipidemia   . Colon polyp     Medications:  Prescriptions prior to admission  Medication Sig Dispense Refill  . acetaminophen (TYLENOL) 500 MG tablet Take 1,000 mg by mouth every 8 (eight) hours as needed for mild pain.       Marland Kitchen. amLODipine (NORVASC) 10 MG tablet Take 10 mg by mouth daily.      Marland Kitchen. aspirin 81 MG chewable tablet Chew 81 mg by mouth daily.      . Cholecalciferol (VITAMIN D-3) 1000 UNITS CAPS Take 1 capsule by mouth daily.      . divalproex (DEPAKOTE) 250 MG DR tablet Take 250 mg by mouth 2 (two) times daily.      Marland Kitchen. donepezil (ARICEPT ODT) 10 MG disintegrating tablet Take 10 mg by mouth at bedtime.      . ferrous fumarate (HEMOCYTE - 106 MG FE) 325 (106 FE) MG TABS tablet Take 1 tablet by mouth daily.      . Menthol, Topical Analgesic, (BENGAY EX) Apply 1 application topically 2 (two) times  daily as needed. For knee pain      . senna-docusate (SENOKOT S) 8.6-50 MG per tablet Take 1 tablet by mouth at bedtime.      . sertraline (ZOLOFT) 100 MG tablet Take 100 mg by mouth daily.        Assessment: 80 y/o female resident of a NH on Depakote PTA for seizure disorder. She was admitted for syncope. She has had no new seizures however her valproic acid level is subtherapeutic. Pharmacy consulted to adjust dose to a therapeutic concentration.  Goal of Therapy:  Valproic acid level 50-100 mcg/ml  Plan:  -Increase Depakote to 500 mg PO bid (13 mg/kg/day) -Check valproic level in 3 - 4 days  [facility] LLCJennifer Troy, [place]orgetownPharm.D., BCPS Clinical Pharmacist Pager: [phone] 10/30/2013 10:47 AM    Addendum:  Pharmacy consulted to manage Lovenox for new DVT. Patient has been on Lovenox since this morning. Current dosing is appropriate. Hb is low, platelets are wnl. No bleeding noted.  Plan: -Continue Lovenox 75 mg SQ q12h -CBC q72h while on Lovenox -F/u long term anticoagulant plans  [facility] LLCJennifer [place], 17[address] Rainbow BoulevardPharm.D., BCPS Clinical Pharmacist Pager: [phone] 10/30/2013 12:52 PM

## 2013-10-30 NOTE — Progress Notes (Addendum)
[facility] PROGRESS NOTE  Sheryl Jones ZOX:[id number] DOB: Oct 12, 1934 DOA: 10/29/2013 PCP: Karlene Einstein, MD  HPI/Subjective: Sheryl Jones, a 63yr AA female with a PMH of CVA, HTN, advanced dementia, DM2, and DNR code status was nonverbal and unresponsive this am.  She awoke with verbal and physical cues from provider. CC is syncope and right leg edema. Two daughters in room gave hx of syncopal event that led to admission. Evalaution in the ER included a normal renal fx test, no leukocytosis, and a negative right knee film, cervical CT, bilateral hip film.  Assessment/Plan:  Syncope  - Slumped over in wheel chair, no jerking mvmts, or urinary incontinence.  - ddx includes:  Secondary to UTI. CVA. Hypoglycemia.  - ddimer was elevated, but CT Angio chest was neg for PE, CT head is pending.  - Patient is wheel chair bound.  We will request PT consult before returning to SNF.  UTI  -Many bacteria in u/a.  Had previous UTI in Oct per daughters.  Caused similar symptoms.  -Rocephin started.  Culture pending.  Right leg edema  - Painful to palpation just above the knee.  No erythema  - Doppler + for acute DVT in common femoral, right femoral, right popliteal, right posterior tibial.  - Lovenox/coumadin per pharmacy.  - Will ask Case Management if INS will pay for new anticoag such as Xeralto.  Mid Esophageal Thickening on CT  -Concern for possible malignancy  -[facility] consulted, she is a patient of Dr. Marina Goodell.  DM  -hypoglycemia possibility for syncope episode.  Will check CBGs q 4 for 24 hours  -changed SSI to sensitive.  Doesn't appear to be on any DM meds outpatient.  -Hgb A1C was 6.3 recently.  Seizure Disorder  -Valproic Acid level is subtherapeutic  -Will ask pharmacy to increase dose.  -No frank seizures since admission.   Hx CVA/Dementia  - wheel chair bound, and non verbal      DVT Prophylaxis:  lovenox / coumadin per pharmacy. Code Status: DNR Family  Communication: 2 daughters were present in current encounter Disposition Plan: Inpatient   Consultants:  None  Procedures:  Doppler US  Antibiotics:  Rocephin 1/8  Objective: Filed Vitals:   10/30/13 0230 10/30/13 0300 10/30/13 0330 10/30/13 0418  BP: 127/53 134/55 122/66 148/81  Pulse: 75 67 72 70  Temp:    98.6 F (37 C)  TempSrc:    Oral  Resp:    18  Height:    5\' 7"  (1.702 m)  Weight:    76.794 kg (169 lb 4.8 oz)  SpO2: 99% 99% 96% 100%    Intake/Output Summary (Last 24 hours) at 10/30/13 0954 Last data filed at 10/30/13 8119  Gross per 24 hour  Intake      0 ml  Output    152 ml  Net   -152 ml   Filed Weights   10/30/13 0418  Weight: 76.794 kg (169 lb 4.8 oz)    Exam: General: WDWN, NAD, appears stated age Neck: Supple, no JVD, no masses  Cardiovascular: RRR, S1 S2 auscultated, no rubs, murmurs or gallops.   Respiratory: Clear to auscultation bilaterally with equal chest rise  Abdomen: Soft, nontender, nondistended, + bowel sounds  Extremities:  Right leg has considerable edema. Pain was incited with palpation at distal thigh above knee Skin: Without rashes exudates or nodules.   Psych: No communication and unresponsive. Lacking capacity   Data Reviewed: Basic Metabolic Panel:  Recent Labs Lab 10/29/13 2020  NA  147  K 3.7  CL 102  CO2 29  GLUCOSE 141*  BUN 14  CREATININE 0.64  CALCIUM 9.2   CBC:  Recent Labs Lab 10/29/13 2020  WBC 5.7  NEUTROABS 2.6  HGB 10.0*  HCT 32.8*  MCV 79.0  PLT 378   Cardiac Enzymes:  Recent Labs Lab 10/30/13 0540  TROPONINI <0.30   CBG:  Recent Labs Lab 10/30/13 0428 10/30/13 0722  GLUCAP 84 83      Studies: Dg Chest 1 View  10/29/2013   CLINICAL DATA:  Dementia  EXAM: CHEST - 1 VIEW  COMPARISON:  09/01/2013  FINDINGS: Normal heart size.  Clear lungs.  No pneumothorax.  IMPRESSION: No active cardiopulmonary disease.   Electronically Signed   By: Maryclare Bean M.D.   On: 10/29/2013 21:52    Dg Hip Complete Left  10/29/2013   CLINICAL DATA:  Pain post trauma  EXAM: LEFT HIP - COMPLETE 2+ VIEW  COMPARISON:  None.  FINDINGS: Frontal pelvis as well as frontal and lateral left hip joint images were obtained. There is moderate symmetric narrowing of both hip joints. No fracture or dislocation. No erosive change. Bones are osteoporotic.  IMPRESSION: Osteoarthritic change in both hip joints. Bones osteoporotic. No acute fracture or dislocation.   Electronically Signed   By: Bretta Bang M.D.   On: 10/29/2013 21:56   Dg Femur Right  10/29/2013   CLINICAL DATA:  Pain post trauma  EXAM: RIGHT FEMUR - 2 VIEW  COMPARISON:  None.  FINDINGS: Frontal and lateral views were obtained. There is no fracture or dislocation. There is a moderate knee joint effusion. There is arthropathy in the right knee joint region. There is mild narrowing of the left hip joint.  IMPRESSION: Moderate knee joint effusion. Osteoarthritic change in the right knee and to a lesser extent right hip joints. No fracture or dislocation apparent.   Electronically Signed   By: Bretta Bang M.D.   On: 10/29/2013 21:53   Ct Angio Chest Pe W/cm &/or Wo Cm  10/30/2013   CLINICAL DATA:  Difficulty breathing  EXAM: CT ANGIOGRAPHY CHEST WITH CONTRAST  TECHNIQUE: Multidetector CT imaging of the chest was performed using the standard protocol during bolus administration of intravenous contrast. Multiplanar CT image reconstructions including MIPs were obtained to evaluate the vascular anatomy.  CONTRAST:  54mL OMNIPAQUE IOHEXOL 350 MG/ML SOLN  COMPARISON:  Chest radiograph October 29, 2013  FINDINGS: There is no demonstrable pulmonary embolus. There is slight dilatation of the ascending thoracic aorta without frank aneurysm. There is no aortic dissection.  There is slight atelectatic change in the left base. There is no edema or consolidation.  There is no appreciable thoracic adenopathy. The pericardium is not thickened.  There is thickening  of the wall of the midesophagus with mild dilatation of the esophagus in this area.  Visualized upper abdominal structures appear normal. There is degenerative change throughout the thoracic spine. There are no blastic or lytic bone lesions.  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 The right lobe of the thyroid is incompletely visualized. It does appear inhomogeneous with questionable nodular lesions.  Review of the MIP images confirms the above findings.  IMPRESSION: No demonstrable pulmonary embolus.  No edema or consolidation.  Soft tissue fullness and thickening in the mid esophagus. This finding may indicate inflammatory lesion or conceivably infiltrating neoplasm. Direct visualization of the esophagus is warranted given this appearance.  Suspect multinodular goiter. Note that thyroid is incompletely visualized. Nonemergent thyroid  ultrasound advised.  No apparent adenopathy.  Mild prominence of the ascending thoracic aorta without frank aneurysm.   Electronically Signed   By: Bretta Bang M.D.   On: 10/30/2013 00:46   Ct Cervical Spine Wo Contrast  10/29/2013   CLINICAL DATA:  Altered mental status; neck pain  EXAM: CT HEAD WITHOUT CONTRAST  CT CERVICAL SPINE WITHOUT CONTRAST  TECHNIQUE: Multidetector CT imaging of the head and cervical spine was performed following the standard protocol without intravenous contrast. Multiplanar CT image reconstructions of the cervical spine were also generated. Study was obtained within 24 hr of patient's arrival at the emergency department.  COMPARISON:  Brain CT September 01, 2013  FINDINGS: CT HEAD FINDINGS  There is moderately severe diffuse atrophy. The ventricles are quite prominent, raising question of underlying communicating hydrocephalus.  There is no demonstrable mass, hemorrhage, extra-axial fluid collection, or midline shift. There is a small prior infarct in the mid left cerebellum, stable. There is mild small vessel disease in the centra semiovale  bilaterally. There is no new gray-white compartment lesion. No demonstrable acute infarct.  The bony calvarium appears intact. The mastoid air cells are clear. There is debris in the right external auditory canal.  CT CERVICAL SPINE FINDINGS  There is a mild degree of motion artifact. There is no demonstrable fracture or spondylolisthesis. Prevertebral soft tissues and predental space regions are normal.  There is moderate disc space narrowing at all levels except for C2-3. There are prominent anterior osteophytes at all levels except for C2. There is moderate facet osteoarthritic change at all levels without nerve root edema or effacement appreciable. No disc extrusion or stenosis apparent. Note that there is localized calcification in the posterior longitudinal ligament at C3-4 causing mild impression on the ventral cord.  The thyroid has an inhomogeneous appearance. 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  IMPRESSION: CT head: Extensive atrophy. Question a degree of concomitant communicating hydrocephalus. There is mild small vessel disease in the centra semiovale bilaterally as well as a small prior infarct in the mid left cerebellum. There is no intracranial mass or hemorrhage. No acute appearing infarct. There is debris in the external auditory canal on the right; question cerumen impaction.  CT cervical spine: Multilevel spondylosis and osteoarthritis. No disc extrusion or stenosis. Calcification in the posterior longitudinal ligament at C3-4 causes mild impression on the ventral cord without frank stenosis. No fracture or spondylolisthesis.  Hypoplastic left lobe of thyroid. Inhomogeneity in the right lobe; this finding may warrant nonemergent thyroid ultrasound to further assess.   Electronically Signed   By: Bretta Bang M.D.   On: 10/29/2013 22:10   Dg Knee Complete 4 Views Right  10/29/2013   CLINICAL DATA:  Pain post trauma  EXAM: RIGHT KNEE - COMPLETE 4+ VIEW  COMPARISON:  None.  FINDINGS: Frontal,  lateral, and bilateral oblique views were obtained. There is no demonstrable fracture or dislocation. There is a moderate knee joint effusion. There is moderate generalized osteoarthritic change, most marked medially and in the patellofemoral joint regions. Bones are osteoporotic.  IMPRESSION: Moderate joint effusion. No acute fracture or dislocation. Osteoarthritic change with narrowing rather marked medially and in the patellofemoral joint regions.   Electronically Signed   By: Bretta Bang M.D.   On: 10/29/2013 21:54    Scheduled Meds: . amLODipine  10 mg Oral Daily  . aspirin  81 mg Oral Daily  . cefTRIAXone (ROCEPHIN)  IV  1 g Intravenous Q24H  . cholecalciferol  1,000 Units Oral Daily  .  divalproex  250 mg Oral BID  . donepezil  10 mg Oral QHS  . enoxaparin (LOVENOX) injection  1 mg/kg Subcutaneous BID  . ferrous fumarate  1 tablet Oral Daily  . insulin aspart  0-9 Units Subcutaneous Q4H  . senna-docusate  1 tablet Oral QHS  . sertraline  100 mg Oral Daily  . sodium chloride  3 mL Intravenous Q12H   Continuous Infusions:   Principal Problem:   Syncope and collapse Active Problems:   Leg edema, right   Leg swelling   Dementia arising in the senium and presenium   HTN (hypertension)   Diabetes mellitus   Seizure disorder   CVA (cerebral infarction)    Martina SinnerIsaac Stappas, PA-S  Algis DownsMarianne York, PA-C [facility] Pager [phone]. If 7PM-7AM, please contact night-coverage at [email or web address], password [email or web address] 10/30/2013, 9:54 AM  LOS: 1 day

## 2013-10-30 NOTE — Progress Notes (Signed)
Clinical Social Work Department BRIEF PSYCHOSOCIAL ASSESSMENT 10/30/2013  Patient:  [facility], Sheryl Jones,Sheryl Jones     Account Number:  [id number]     Admit date:  10/29/2013  Clinical Social Worker:  Harless NakayamaAMBELAL,Haidar Muse, LCSWA  Date/Time:  10/30/2013 11:30 AM  Referred by:    Date Referred:  10/30/2013 Referred for  SNF Placement   Other Referral:   Interview type:  Family Other interview type:   Spoke with family at pt bedside    PSYCHOSOCIAL DATA Living Status:  FACILITY [facility] Level of care:  Skilled Nursing Facility Primary support name:  Dorette GrateJerusha Jones [phone] Primary support relationship to patient:  CHILD, ADULT Degree of support available:   Pt has very supportive family that are involved in care decisions    CURRENT CONCERNS Current Concerns  Post-Acute Placement   Other Concerns:    SOCIAL WORK ASSESSMENT / PLAN CSW made aware that pt was admitted from SNF. CSW visited pt room and spoke with family at bedside. Family was able to confirm that [facility] and plan will be to return. Pt family thankful for CSW visit as they were concerned about getting in contact with [facility] to confirm pt will return and they would like her to remain in the same room. CSW informed family this would be communicated to the facility.   Assessment/plan status:  Psychosocial Support/Ongoing Assessment of Needs Other assessment/ plan:   Information/referral to community resources:   None needed    PATIENT'S/FAMILY'S RESPONSE TO PLAN OF CARE: Pt family agreeable to pt returning to [facility].     Glenda Spelman, LCSWA [phone]

## 2013-10-30 NOTE — Procedures (Signed)
ELECTROENCEPHALOGRAM REPORT   Patient: Sheryl Jones       Room #: 0A543W29  EEG No. ID: [id number] Age: 80 y.o.        Sex: female Referring Physician: Verner MouldJ. Vann Report Date:  10/30/2013        Interpreting Physician: Aline BrochureSTEWART,Dewight Catino R  History: Sheryl Jones is an 80 y.o. female history hypertension, diabetes mellitus, dementia, and seizure disorder admitted following an episode of loss of consciousness with no seizure activity described and no postictal state described as well.  Indications for study:  Rule out recurrent seizure activity  Technique: This is an 18 channel routine scalp EEG performed at the bedside with bipolar and monopolar montages arranged in accordance to the international 10/20 system of electrode placement.   Description: This EEG recording was performed during wakefulness. Predominant background activity consisted of mixed irregular continuous symmetrical delta and theta activity of low to moderate amplitude. Photic stimulation was not performed. Hyperventilation was not performed. No epileptiform discharges were recorded.  Interpretation: This EEG is abnormal with mild generalized nonspecific continuous slowing of cerebral activity. This pattern of slowing can be seen with a lot of variety of encephalopathies, including metabolic as well as degenerative disorders. No evidence of epileptic activity was demonstrated.  Sheryl MaxonR Sheryl Jones M.D. [facility] [phone]

## 2013-10-30 NOTE — Consult Note (Signed)
[facility] Consult: 2:55 PM 10/30/2013  LOS: 1 day    Referring Provider: Dr Eliseo Squires  Primary Care Physician:  Ezequiel Kayser, MD Primary Gastroenterologist:  Dr. Henrene Pastor, seen once in office 12/2012    Reason for Consultation:  Abnormal CT imaging of esophagus.    HPI: Sheryl Jones is a 80 y.o. female.  Nursing home resident with dementia.  Essentially wheelchair bound.  Admitted yesterday with right leg DVT, brief episode of unresponsiveness. Hx of iron deficiency anemia, type 2 DM. Marland Kitchen  On CT chest has soft tissue fullness and thickening in the mid esophagus. Felt to need direct imaging in order to rule out neoplasm. Also has multinodular goiter on CT.   12/2012 seen in office by Dr Scarlette Shorts for chronic microcytic anemia.  Dr Henrene Pastor favored foregoing endoscopic evaluations and treating empirically with iron and PPI.  Pt did not undergo endoscopic evaluation.   Pt has had dysphagia with what sounds like oral delay in swallowing. Not choking or coughing with po. No hx of regurgitation or food impaction.  Is on pureed diet and thin liquids at SNF.  Eats 50 to 80% of her meals. Dress size change from 2x to x large in last 12 months.  No complaint of pain in throat.   The med list from SNF does not include any PPI or H 2 blockers, it does include oral iron and prn laxatives:  MOM.Marland Kitchen   s/p remote colonoscopy with finding of polyps (unknown type) in [place] in 1990s. No previous EGDs per family's recall.   Past Medical History  Diagnosis Date  . Dementia   . Hypertension   . Diabetes mellitus   . Iron deficiency anemia   . Hypokalemia   . Depressive disorder   . Anxiety   . Unspecified epilepsy without mention of intractable epilepsy   . Other and unspecified hyperlipidemia   . Colon polyp     History  reviewed. No pertinent past surgical history.  Prior to Admission medications   Medication Sig Start Date End Date Taking? Authorizing Provider  acetaminophen (TYLENOL) 500 MG tablet Take 1,000 mg by mouth every 8 (eight) hours as needed for mild pain.    Yes Historical Provider, MD  amLODipine (NORVASC) 10 MG tablet Take 10 mg by mouth daily.   Yes Historical Provider, MD  aspirin 81 MG chewable tablet Chew 81 mg by mouth daily.   Yes Historical Provider, MD  Cholecalciferol (VITAMIN D-3) 1000 UNITS CAPS Take 1 capsule by mouth daily.   Yes Historical Provider, MD  divalproex (DEPAKOTE) 250 MG DR tablet Take 250 mg by mouth 2 (two) times daily.   Yes Historical Provider, MD  donepezil (ARICEPT ODT) 10 MG disintegrating tablet Take 10 mg by mouth at bedtime.   Yes Historical Provider, MD  ferrous fumarate (HEMOCYTE - 106 MG FE) 325 (106 FE) MG TABS tablet Take 1 tablet by mouth daily.   Yes Historical Provider, MD  Menthol, Topical Analgesic, (BENGAY EX) Apply 1 application topically 2 (two) times daily as needed. For knee pain  Yes Historical Provider, MD  senna-docusate (SENOKOT S) 8.6-50 MG per tablet Take 1 tablet by mouth at bedtime. 01/23/13  Yes Ripudeep Krystal Eaton, MD  sertraline (ZOLOFT) 100 MG tablet Take 100 mg by mouth daily.   Yes Historical Provider, MD    Scheduled Meds: . amLODipine  10 mg Oral Daily  . aspirin  81 mg Oral Daily  . cefTRIAXone (ROCEPHIN)  IV  1 g Intravenous Q24H  . cholecalciferol  1,000 Units Oral Daily  . divalproex  500 mg Oral BID  . donepezil  10 mg Oral QHS  . enoxaparin (LOVENOX) injection  1 mg/kg Subcutaneous BID  . feeding supplement (ENSURE COMPLETE)  237 mL Oral BID BM  . feeding supplement (ENSURE)  1 Container Oral BID BM  . ferrous fumarate  1 tablet Oral Daily  . insulin aspart  0-9 Units Subcutaneous Q4H  . senna-docusate  1 tablet Oral QHS  . sertraline  100 mg Oral Daily  . sodium chloride  3 mL Intravenous Q12H   Infusions:   PRN  Meds: acetaminophen   Allergies as of 10/29/2013  . (No Known Allergies)    Family History  Problem Relation Age of Onset  . Heart attack Mother   . Heart attack Father   . Breast cancer Sister     meta  . Hypertension Father   . Hypertension Daughter     History   Social History  . Marital Status: Single    Spouse Name: N/A    Number of Children: 6  . Years of Education: N/A   Occupational History  . retired    Social History Main Topics  . Smoking status: Former Smoker    Types: Cigarettes  . Smokeless tobacco: Never Used  . Alcohol Use: No  . Drug Use: No  . Sexual Activity: Not on file    REVIEW OF SYSTEMS: Constitutional:  Per HPI.  Non-ambulatory since 07/2013 ENT:  No nose bleeds Pulm:  No cough or dyspnea CV:  No palpitations, no LE edema.  GU:  No hematuria, no frequency GI:  Per HPI.  Daily BMs.  Incontinent of stool and urine.  No known testing for FOB in last few years. Heme:  Per HPI   Transfusions:  None per dtrs recall.  Neuro:  No headaches, no peripheral tingling or numbness Derm:  No itching, no rash or sores.  Endocrine:  No sweats or chills.  No polyuria or dysuria Immunization:  Up to date flu shot.  Travel:  None beyond local counties in last few months.    PHYSICAL EXAM: Vital signs in last 24 hours: Filed Vitals:   10/30/13 1131  BP: 158/71  Pulse:   Temp:   Resp:    Wt Readings from Last 3 Encounters:  10/30/13 76.794 kg (169 lb 4.8 oz)  12/31/12 99.338 kg (219 lb)  08/27/11 95 kg (209 lb 7 oz)    General: overweight AAF who is comfortable and smiling but does not respond to voice, non-verbal.  Head:  No asymmetry or facial edema  Eyes:  No icterus or pallor Ears:  No obvious hearing deficits  Nose:  No discharge, no congestion, no sneezing Mouth:  Clear and moist oral mm. Only 2 lower front teeth remain.   Neck:  No JVD, no bruits, no mass Lungs:  Clear bil to auscultation and percussion.  Heart: RRR.  No  mrg. Abdomen:  Soft, obese, NT, ND, no mass, no bruits.   Rectal: deferred  Musc/Skeltl: no joint contractures or erythema Extremities:  Right LE edema  Neurologic:  No tremor, does not follow commands.  Skin:  No sores, no rash, no purpura.   Tattoos:  none Nodes:  No cervical adenopathy.   Psych:  Flat affect, unable to participate in dialogue or follow commands.   Intake/Output from previous day:   Intake/Output this shift: Total I/O In: 240 [P.O.:240] Out: 152 [Urine:150; Stool:2]  LAB RESULTS:  Recent Labs  10/29/13 2020  WBC 5.7  HGB 10.0*  HCT 32.8*  PLT 378  MCV    65  BMET Lab Results  Component Value Date   NA 147 10/29/2013   NA 143 01/20/2013   NA 143 01/20/2013   K 3.7 10/29/2013   K 4.1 01/20/2013   K 4.3 01/20/2013   CL 102 10/29/2013   CL 107 01/20/2013   CL 103 01/20/2013   CO2 29 10/29/2013   CO2 24 01/20/2013   CO2 29 06/09/2012   GLUCOSE 141* 10/29/2013   GLUCOSE 126* 01/20/2013   GLUCOSE 121* 01/20/2013   BUN 14 10/29/2013   BUN 22 01/20/2013   BUN 20 01/20/2013   CREATININE 0.64 10/29/2013   CREATININE 0.60 01/20/2013   CREATININE 0.80 01/20/2013   CALCIUM 9.2 10/29/2013   CALCIUM 9.4 01/20/2013   CALCIUM 9.0 06/09/2012   LFT No results found for this basename: PROT, ALBUMIN, AST, ALT, ALKPHOS, BILITOT, BILIDIR, IBILI,  in the last 72 hours PT/INR Lab Results  Component Value Date   INR 0.94 01/20/2013   INR 0.93 08/27/2011    RADIOLOGY STUDIES: Dg Chest 1 View 10/29/2013     IMPRESSION: No active cardiopulmonary disease.   Electronically Signed   By: Maryclare Bean M.D.   On: 10/29/2013 21:52   Dg Hip Complete Left 10/29/2013  .  IMPRESSION: Osteoarthritic change in both hip joints. Bones osteoporotic. No acute fracture or dislocation.   Electronically Signed   By: Lowella Grip M.D.   On: 10/29/2013 21:56   Dg Femur Right 10/29/2013    IMPRESSION: Moderate knee joint effusion. Osteoarthritic change in the right knee and to a lesser extent right hip joints. No  fracture or dislocation apparent.   Electronically Signed   By: Lowella Grip M.D.   On: 10/29/2013 21:53   Ct Angio Chest Pe W/cm &/or Wo Cm 10/30/2013 FINDINGS: There is no demonstrable pulmonary embolus. There is slight dilatation of the ascending thoracic aorta without frank aneurysm. There is no aortic dissection.  There is slight atelectatic change in the left base. There is no edema or consolidation.  There is no appreciable thoracic adenopathy. The pericardium is not thickened.  There is thickening of the wall of the midesophagus with mild dilatation of the esophagus in this area.  Visualized upper abdominal structures appear normal. There is degenerative change throughout the thoracic spine. There are no blastic or lytic bone lesions.  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 The right lobe of the thyroid is incompletely visualized. It does appear inhomogeneous with questionable nodular lesions.  Review of the MIP images confirms the above findings.  IMPRESSION: No demonstrable pulmonary embolus.  No edema or consolidation.  Soft tissue fullness and thickening in the mid esophagus. This finding may indicate inflammatory lesion or conceivably infiltrating neoplasm. Direct visualization of the esophagus is warranted given this appearance.  Suspect multinodular goiter. Note that thyroid is incompletely visualized. Nonemergent thyroid ultrasound advised.  No apparent adenopathy.  Mild prominence of the ascending thoracic  aorta without frank aneurysm.   Electronically Signed   By: Lowella Grip M.D.   On: 10/30/2013 00:46   Ct Cervical Spine Wo Contrast 10/29/2013  FINDINGS  There is moderately severe diffuse atrophy. The ventricles are quite prominent, raising question of underlying communicating hydrocephalus.  There is no demonstrable mass, hemorrhage, extra-axial fluid collection, or midline shift. There is a small prior infarct in the mid left cerebellum, stable. There is mild small vessel  disease in the centra semiovale bilaterally. There is no new gray-white compartment lesion. No demonstrable acute infarct.  The bony calvarium appears intact. The mastoid air cells are clear. There is debris in the right external auditory canal.  CT CERVICAL SPINE FINDINGS  There is a mild degree of motion artifact. There is no demonstrable fracture or spondylolisthesis. Prevertebral soft tissues and predental space regions are normal.  There is moderate disc space narrowing at all levels except for C2-3. There are prominent anterior osteophytes at all levels except for C2. There is moderate facet osteoarthritic change at all levels without nerve root edema or effacement appreciable. No disc extrusion or stenosis apparent. Note that there is localized calcification in the posterior longitudinal ligament at C3-4 causing mild impression on the ventral cord.  The thyroid has an inhomogeneous appearance. 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  IMPRESSION: CT head: Extensive atrophy. Question a degree of concomitant communicating hydrocephalus. There is mild small vessel disease in the centra semiovale bilaterally as well as a small prior infarct in the mid left cerebellum. There is no intracranial mass or hemorrhage. No acute appearing infarct. There is debris in the external auditory canal on the right; question cerumen impaction.  CT cervical spine: Multilevel spondylosis and osteoarthritis. No disc extrusion or stenosis. Calcification in the posterior longitudinal ligament at C3-4 causes mild impression on the ventral cord without frank stenosis. No fracture or spondylolisthesis.  Hypoplastic left lobe of thyroid. Inhomogeneity in the right lobe; this finding may warrant nonemergent thyroid ultrasound to further assess.   Electronically Signed   By: Lowella Grip M.D.   On: 10/29/2013 22:10   Dg Knee Complete 4 Views Right 10/29/2013   CLINICAL DATA:  Pain post trauma  EXAM: RIGHT KNEE - COMPLETE 4+ VIEW  COMPARISON:   None.  FINDINGS: Frontal, lateral, and bilateral oblique views were obtained. There is no demonstrable fracture or dislocation. There is a moderate knee joint effusion. There is moderate generalized osteoarthritic change, most marked medially and in the patellofemoral joint regions. Bones are osteoporotic.  IMPRESSION: Moderate joint effusion. No acute fracture or dislocation. Osteoarthritic change with narrowing rather marked medially and in the patellofemoral joint regions.   Electronically Signed   By: Lowella Grip M.D.   On: 10/29/2013 21:54    ENDOSCOPIC STUDIES: None   IMPRESSION:   *  CT imaging of esophagus shows soft tissue fullness and thickening in mid esophagus.  Had been prescribed PPI in 01/2013 but no longer receiving this according to records.  Oral phase dysphagia. On puree/thin liquids at nursing home. Eats fairly well but losing weight.   *  Right LE DVT. No PE on CT angio. On Lovenox.  Has not started Warfarin or Xarelto yet   *  Recurrent syncope, hx of same on other occassions most recently in 07/2013 at which time she was admitted to [facility].  EEG done this afternoon, results pending.  *  Advanced dementia  *  Iron deficiency anemia.  Persistent microcytic anemia despite once daily  Hemocyte.   *  Multinodular goiter by yesterday's CT scan.  TSH normal.   *  Bacteruria.  On empiric Rocephin.     PLAN:     *  EGD tomorrow.  Orders placed.  Case discussed between daughters and Dr Edison Nasuti.  They wish to proceed.  *  Stopped po iron, as it confounds endoscopist as to presence of blood.  Also stopping Lovenox after midnight in order to allow for safe biopsy of esophagus, so she will not get the 0600 dose tomorrow.  This will need to be restarted after EGD.  *  Does she need telemetry?   Azucena Freed  10/30/2013, 2:55 PM Pager: [phone]  ________________________________________________________________________  Velora Heckler [facility] MD note:  I  reviewed the data and agree with the assessment and plan described above.  I met with her daughters while she was getting EEG,  Agree with the above plan, EGD to check for esophageal pathology.   Owens Loffler, MD [facility] Pager [phone]

## 2013-10-30 NOTE — Progress Notes (Signed)
UR completed. Patient changed to inpatient r/t requiring IV antibiotics.  

## 2013-10-30 NOTE — Progress Notes (Signed)
VASCULAR LAB PRELIMINARY  PRELIMINARY  PRELIMINARY  PRELIMINARY  Right lower extremity venous Doppler completed.    Preliminary report:  There is acute DVT noted in the right common femoral and femoral veins.  There is age indeterminate DVT noted in the right popliteal and posterior tibial veins.  Patient refuses to allow me to scan left common femoral vein, but there is no propagation of DVT noted to left femoral vein.  Kaien Pezzullo, RVT 10/30/2013, 10:34 AM

## 2013-10-30 NOTE — H&P (Signed)
[facility] History and Physical  Sheryl DuffMary Carr KVQ:[id number] DOB: Jul 10, 1934    PCP:   Karlene EinsteinASANAYAKA,GAYANI, MD   Chief Complaint: syncope and right leg edema.  HPI: Sheryl DuffMary Jones is an 80 y.o. female with hx of prior CVA, HTN, advanced dementia, DNR code status, DM2, baseline nonverbal and nonresponding to environment, had a syncopal episode tonight.  She is a resident of a NH, and usually on a wheelchair, slumped over and was unresponsive.  When staff rubbed her sternum, she came about.  There was no seizure activities, bowel or bladder incontinence and no post ictal.  She had these symptoms previously, but it has been very infrequent.  Evalaution in the ER included a normal renal fx test, no leukocytosis, and a negative right knee film, cervical CT, bilateral hip films, and negative head CT.  Hospitalist was asked to admit her for further work up, including evaluate for DVT, and syncopal work up.  Rewiew of Systems:   She is non communicative.   Past Medical History  Diagnosis Date  . Dementia   . Hypertension   . Diabetes mellitus   . Iron deficiency anemia   . Hypokalemia   . Depressive disorder   . Anxiety   . Unspecified epilepsy without mention of intractable epilepsy   . Other and unspecified hyperlipidemia   . Colon polyp     History reviewed. No pertinent past surgical history.  Medications:  HOME MEDS: Prior to Admission medications   Medication Sig Start Date End Date Taking? Authorizing Provider  acetaminophen (TYLENOL) 500 MG tablet Take 1,000 mg by mouth every 8 (eight) hours as needed for mild pain.    Yes Historical Provider, MD  amLODipine (NORVASC) 10 MG tablet Take 10 mg by mouth daily.   Yes Historical Provider, MD  aspirin 81 MG chewable tablet Chew 81 mg by mouth daily.   Yes Historical Provider, MD  Cholecalciferol (VITAMIN D-3) 1000 UNITS CAPS Take 1 capsule by mouth daily.   Yes Historical Provider, MD  divalproex (DEPAKOTE) 250 MG DR tablet Take  250 mg by mouth 2 (two) times daily.   Yes Historical Provider, MD  donepezil (ARICEPT ODT) 10 MG disintegrating tablet Take 10 mg by mouth at bedtime.   Yes Historical Provider, MD  ferrous fumarate (HEMOCYTE - 106 MG FE) 325 (106 FE) MG TABS tablet Take 1 tablet by mouth daily.   Yes Historical Provider, MD  Menthol, Topical Analgesic, (BENGAY EX) Apply 1 application topically 2 (two) times daily as needed. For knee pain   Yes Historical Provider, MD  senna-docusate (SENOKOT S) 8.6-50 MG per tablet Take 1 tablet by mouth at bedtime. 01/23/13  Yes Ripudeep Jenna LuoK Rai, MD  sertraline (ZOLOFT) 100 MG tablet Take 100 mg by mouth daily.   Yes Historical Provider, MD     Allergies:  No Known Allergies  Social History:   reports that she has quit smoking. Her smoking use included Cigarettes. She smoked 0.00 packs per day. She has never used smokeless tobacco. She reports that she does not drink alcohol or use illicit drugs.  Family History: Family History  Problem Relation Age of Onset  . Heart attack Mother   . Heart attack Father   . Breast cancer Sister     meta  . Hypertension Father   . Hypertension Daughter      Physical Exam: Filed Vitals:   10/30/13 0230 10/30/13 0300 10/30/13 0330 10/30/13 0418  BP: 127/53 134/55 122/66 148/81  Pulse: 75 67 72  70  Temp:    98.6 F (37 C)  TempSrc:    Oral  Resp:    18  Height:    5\' 7"  (1.702 m)  Weight:    76.794 kg (169 lb 4.8 oz)  SpO2: 99% 99% 96% 100%   Blood pressure 148/81, pulse 70, temperature 98.6 F (37 C), temperature source Oral, resp. rate 18, height 5\' 7"  (1.702 m), weight 76.794 kg (169 lb 4.8 oz), SpO2 100.00%.  GEN: patient lying in the stretcher in no acute distress; cooperative with exam. PSYCH:  alert and oriented x4; does not appear anxious or depressed; affect is appropriate. HEENT: Mucous membranes pink and anicteric; PERRLA; EOM intact; no cervical lymphadenopathy nor thyromegaly or carotid bruit; no JVD; There were  no stridor. Neck is very supple. Breasts:: Not examined CHEST WALL: No tenderness CHEST: Normal respiration, clear to auscultation bilaterally.  HEART: Regular rate and rhythm.  There are no murmur, rub, or gallops.   BACK: No kyphosis or scoliosis; no CVA tenderness ABDOMEN: soft and non-tender; no masses, no organomegaly, normal abdominal bowel sounds; no pannus; no intertriginous candida. There is no rebound and no distention. Rectal Exam: Not done EXTREMITIES: No bone or joint deformity; age-appropriate arthropathy of the hands and knees; right leg is severely swollen, no ulcerations.  There is no calf tenderness. Genitalia: not examined PULSES: 2+ and symmetric SKIN: Normal hydration no rash or ulceration ZOX:WRUEAV to fully evaluated.   Labs on Admission:  Basic Metabolic Panel:  Recent Labs Lab 10/29/13 2020  NA 147  K 3.7  CL 102  CO2 29  GLUCOSE 141*  BUN 14  CREATININE 0.64  CALCIUM 9.2   Liver Function Tests: No results found for this basename: AST, ALT, ALKPHOS, BILITOT, PROT, ALBUMIN,  in the last 168 hours No results found for this basename: LIPASE, AMYLASE,  in the last 168 hours No results found for this basename: AMMONIA,  in the last 168 hours CBC:  Recent Labs Lab 10/29/13 2020  WBC 5.7  NEUTROABS 2.6  HGB 10.0*  HCT 32.8*  MCV 79.0  PLT 378   Cardiac Enzymes: No results found for this basename: CKTOTAL, CKMB, CKMBINDEX, TROPONINI,  in the last 168 hours  CBG:  Recent Labs Lab 10/30/13 0428  GLUCAP 84     Radiological Exams on Admission: Dg Chest 1 View  10/29/2013   CLINICAL DATA:  Dementia  EXAM: CHEST - 1 VIEW  COMPARISON:  09/01/2013  FINDINGS: Normal heart size.  Clear lungs.  No pneumothorax.  IMPRESSION: No active cardiopulmonary disease.   Electronically Signed   By: Maryclare Bean M.D.   On: 10/29/2013 21:52   Dg Hip Complete Left  10/29/2013   CLINICAL DATA:  Pain post trauma  EXAM: LEFT HIP - COMPLETE 2+ VIEW  COMPARISON:  None.   FINDINGS: Frontal pelvis as well as frontal and lateral left hip joint images were obtained. There is moderate symmetric narrowing of both hip joints. No fracture or dislocation. No erosive change. Bones are osteoporotic.  IMPRESSION: Osteoarthritic change in both hip joints. Bones osteoporotic. No acute fracture or dislocation.   Electronically Signed   By: Bretta Bang M.D.   On: 10/29/2013 21:56   Dg Femur Right  10/29/2013   CLINICAL DATA:  Pain post trauma  EXAM: RIGHT FEMUR - 2 VIEW  COMPARISON:  None.  FINDINGS: Frontal and lateral views were obtained. There is no fracture or dislocation. There is a moderate knee joint effusion. There is arthropathy  in the right knee joint region. There is mild narrowing of the left hip joint.  IMPRESSION: Moderate knee joint effusion. Osteoarthritic change in the right knee and to a lesser extent right hip joints. No fracture or dislocation apparent.   Electronically Signed   By: Bretta Bang M.D.   On: 10/29/2013 21:53   Ct Angio Chest Pe W/cm &/or Wo Cm  10/30/2013   CLINICAL DATA:  Difficulty breathing  EXAM: CT ANGIOGRAPHY CHEST WITH CONTRAST  TECHNIQUE: Multidetector CT imaging of the chest was performed using the standard protocol during bolus administration of intravenous contrast. Multiplanar CT image reconstructions including MIPs were obtained to evaluate the vascular anatomy.  CONTRAST:  54mL OMNIPAQUE IOHEXOL 350 MG/ML SOLN  COMPARISON:  Chest radiograph October 29, 2013  FINDINGS: There is no demonstrable pulmonary embolus. There is slight dilatation of the ascending thoracic aorta without frank aneurysm. There is no aortic dissection.  There is slight atelectatic change in the left base. There is no edema or consolidation.  There is no appreciable thoracic adenopathy. The pericardium is not thickened.  There is thickening of the wall of the midesophagus with mild dilatation of the esophagus in this area.  Visualized upper abdominal structures  appear normal. There is degenerative change throughout the thoracic spine. There are no blastic or lytic bone lesions.  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 The right lobe of the thyroid is incompletely visualized. It does appear inhomogeneous with questionable nodular lesions.  Review of the MIP images confirms the above findings.  IMPRESSION: No demonstrable pulmonary embolus.  No edema or consolidation.  Soft tissue fullness and thickening in the mid esophagus. This finding may indicate inflammatory lesion or conceivably infiltrating neoplasm. Direct visualization of the esophagus is warranted given this appearance.  Suspect multinodular goiter. Note that thyroid is incompletely visualized. Nonemergent thyroid ultrasound advised.  No apparent adenopathy.  Mild prominence of the ascending thoracic aorta without frank aneurysm.   Electronically Signed   By: Bretta Bang M.D.   On: 10/30/2013 00:46   Ct Cervical Spine Wo Contrast  10/29/2013   CLINICAL DATA:  Altered mental status; neck pain  EXAM: CT HEAD WITHOUT CONTRAST  CT CERVICAL SPINE WITHOUT CONTRAST  TECHNIQUE: Multidetector CT imaging of the head and cervical spine was performed following the standard protocol without intravenous contrast. Multiplanar CT image reconstructions of the cervical spine were also generated. Study was obtained within 24 hr of patient's arrival at the emergency department.  COMPARISON:  Brain CT September 01, 2013  FINDINGS: CT HEAD FINDINGS  There is moderately severe diffuse atrophy. The ventricles are quite prominent, raising question of underlying communicating hydrocephalus.  There is no demonstrable mass, hemorrhage, extra-axial fluid collection, or midline shift. There is a small prior infarct in the mid left cerebellum, stable. There is mild small vessel disease in the centra semiovale bilaterally. There is no new gray-white compartment lesion. No demonstrable acute infarct.  The bony calvarium appears  intact. The mastoid air cells are clear. There is debris in the right external auditory canal.  CT CERVICAL SPINE FINDINGS  There is a mild degree of motion artifact. There is no demonstrable fracture or spondylolisthesis. Prevertebral soft tissues and predental space regions are normal.  There is moderate disc space narrowing at all levels except for C2-3. There are prominent anterior osteophytes at all levels except for C2. There is moderate facet osteoarthritic change at all levels without nerve root edema or effacement appreciable. No disc extrusion or stenosis  apparent. Note that there is localized calcification in the posterior longitudinal ligament at C3-4 causing mild impression on the ventral cord.  The thyroid has an inhomogeneous appearance. 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  IMPRESSION: CT head: Extensive atrophy. Question a degree of concomitant communicating hydrocephalus. There is mild small vessel disease in the centra semiovale bilaterally as well as a small prior infarct in the mid left cerebellum. There is no intracranial mass or hemorrhage. No acute appearing infarct. There is debris in the external auditory canal on the right; question cerumen impaction.  CT cervical spine: Multilevel spondylosis and osteoarthritis. No disc extrusion or stenosis. Calcification in the posterior longitudinal ligament at C3-4 causes mild impression on the ventral cord without frank stenosis. No fracture or spondylolisthesis.  Hypoplastic left lobe of thyroid. Inhomogeneity in the right lobe; this finding may warrant nonemergent thyroid ultrasound to further assess.   Electronically Signed   By: Bretta Bang M.D.   On: 10/29/2013 22:10   Dg Knee Complete 4 Views Right  10/29/2013   CLINICAL DATA:  Pain post trauma  EXAM: RIGHT KNEE - COMPLETE 4+ VIEW  COMPARISON:  None.  FINDINGS: Frontal, lateral, and bilateral oblique views were obtained. There is no demonstrable fracture or dislocation. There is a moderate  knee joint effusion. There is moderate generalized osteoarthritic change, most marked medially and in the patellofemoral joint regions. Bones are osteoporotic.  IMPRESSION: Moderate joint effusion. No acute fracture or dislocation. Osteoarthritic change with narrowing rather marked medially and in the patellofemoral joint regions.   Electronically Signed   By: Bretta Bang M.D.   On: 10/29/2013 21:54    Assessment/Plan Present on Admission:  . Leg edema, right . Seizure disorder . HTN (hypertension) . Dementia arising in the senium and presenium . CVA (cerebral infarction) . Diabetes mellitus . Syncope and collapse . Leg swelling  PLAN:  I suspect she has a right DVT, with her increased immobility and wheelchair bound, will start her on Lovenox full dose tonight and obtain doppler US of the right leg.  Her syncope is unclear, and it has happened before, so will get an EEG.  I don't think any other work up is indicated given her overall condition. For her hx of seizure, will continue her Depakote, and check her level.  For her DM, will use SSI.  She is stable, DNR (confirmed tonight), and will be admitted to [facility] service.  Thank you for asking to participate in her care.  Other plans as per orders.  Code Status: FULL Unk Lightning, MD. [facility] Pager [phone] 7pm to 7am.  10/30/2013, 5:03 AM

## 2013-10-30 NOTE — Progress Notes (Signed)
INITIAL NUTRITION ASSESSMENT  DOCUMENTATION CODES Per approved criteria  -Severe malnutrition in the context of chronic illness   INTERVENTION:  Ensure Pudding PO BID, each supplement provides 170 kcal and 4 grams of protein  Ensure Complete PO BID, each supplement provides 350 kcal and 13 grams of protein  NUTRITION DIAGNOSIS: Malnutrition related to poor oral intake with progressive dementia as evidenced by intake </= 75% of estimated energy requirement for >/= 1 month with 23% weight loss over the past year.  Goal: Intake to meet >90% of estimated nutrition needs.  Monitor:  PO intake, labs, weight trend.  Reason for Assessment: MST  80 y.o. female  Admitting Dx: Syncope and collapse  ASSESSMENT: Patient is a 80 y.o. female with hx of prior CVA, HTN, advanced dementia, DNR code status, DM2, baseline nonverbal and nonresponding to environment, had a syncopal episode tonight. She is a resident of a NH, and usually on a wheelchair, slumped over and was unresponsive. Evalaution in the ER included a normal renal fx test, no leukocytosis, and a negative right knee film, cervical CT, bilateral hip films, and negative head CT.  Patient's family reports a lot of weight loss over the past year. She has been eating fairly well, but definitely less than her usual intake 1 year ago. Intake has declined with progression of dementia. Per family, patient usually tolerates thin liquids well, currently on a diet with nectar thickened liquids. Family requests for patient to try Ensure liquid and pudding.  Patient in a procedure during RD visit, unable to complete nutrition focused physical exam at this time.  Pt meets criteria for severe MALNUTRITION in the context of chronic illness as evidenced by intake </= 75% of estimated energy requirement for >/= 1 month with 23% weight loss over the past year.  Height: Ht Readings from Last 1 Encounters:  10/30/13 5\' 7"  (1.702 m)    Weight: Wt  Readings from Last 1 Encounters:  10/30/13 169 lb 4.8 oz (76.794 kg)    Ideal Body Weight: 61.4 kg  % Ideal Body Weight: 125%  Wt Readings from Last 10 Encounters:  10/30/13 169 lb 4.8 oz (76.794 kg)  12/31/12 219 lb (99.338 kg)  08/27/11 209 lb 7 oz (95 kg)    Usual Body Weight: 219 lb (10 months ago)  % Usual Body Weight: 77%  BMI:  Body mass index is 26.51 kg/(m^2).  Estimated Nutritional Needs: Kcal: 1600-1800 Protein: 80-90 gm Fluid: 1.6-1.8 L  Skin: no wounds  Diet Order: Dysphagia 1 with nectar thick liquids  EDUCATION NEEDS: -No education needs identified at this time   Intake/Output Summary (Last 24 hours) at 10/30/13 1112 Last data filed at 10/30/13 0843  Gross per 24 hour  Intake    240 ml  Output    152 ml  Net     88 ml    Last BM: 1/7   Labs:   Recent Labs Lab 10/29/13 2020  NA 147  K 3.7  CL 102  CO2 29  BUN 14  CREATININE 0.64  CALCIUM 9.2  GLUCOSE 141*    CBG (last 3)   Recent Labs  10/30/13 0428 10/30/13 0722  GLUCAP 84 83    Scheduled Meds: . amLODipine  10 mg Oral Daily  . aspirin  81 mg Oral Daily  . cefTRIAXone (ROCEPHIN)  IV  1 g Intravenous Q24H  . cholecalciferol  1,000 Units Oral Daily  . divalproex  500 mg Oral BID  . donepezil  10 mg  Oral QHS  . enoxaparin (LOVENOX) injection  1 mg/kg Subcutaneous BID  . ferrous fumarate  1 tablet Oral Daily  . insulin aspart  0-9 Units Subcutaneous Q4H  . senna-docusate  1 tablet Oral QHS  . sertraline  100 mg Oral Daily  . sodium chloride  3 mL Intravenous Q12H    Continuous Infusions: None  Past Medical History  Diagnosis Date  . Dementia   . Hypertension   . Diabetes mellitus   . Iron deficiency anemia   . Hypokalemia   . Depressive disorder   . Anxiety   . Unspecified epilepsy without mention of intractable epilepsy   . Other and unspecified hyperlipidemia   . Colon polyp     History reviewed. No pertinent past surgical history.   Joaquin Courts,  RD, LDN, CNSC Pager [phone] After Hours Pager [phone]

## 2013-10-30 NOTE — Progress Notes (Signed)
Routine EEG completed.  

## 2013-10-30 NOTE — ED Provider Notes (Signed)
Medical screening examination/treatment/procedure(s) were conducted as a shared visit with non-physician practitioner(s) and myself.  I personally evaluated the patient during the encounter.  EKG Interpretation   None        Hurman HornJohn M Gurtha Picker, MD 10/30/13 [phone]

## 2013-10-30 NOTE — Care Management Note (Signed)
    Page 1 of 1   10/31/2013     10:07:18 AM   CARE MANAGEMENT NOTE 10/31/2013  Patient:  Sheryl Jones,Sheryl Jones   Account Number:  [id number]  Date Initiated:  10/30/2013  Documentation initiated by:  GRAVES-BIGELOW,Meryle Pugmire  Subjective/Objective Assessment:   Pt admitted for dvt. [facility]. CSW to assist with disposition needs.     Action/Plan:   CM did a benefits check for xarelto. Will mkae pt aare once completed.   Anticipated DC Date:  10/31/2013   Anticipated DC Plan:  SKILLED NURSING FACILITY  In-house referral  Clinical Social Worker      DC Planning Services  CM consult      Choice offered to / List presented to:             Status of service:  Completed, signed off Medicare Important Message given?   (If response is "NO", the following Medicare IM given date fields will be blank) Date Medicare IM given:   Date Additional Medicare IM given:    Discharge Disposition:  SKILLED NURSING FACILITY  Per UR Regulation:  Reviewed for med. necessity/level of care/duration of stay  If discussed at Long Length of Stay Meetings, dates discussed:    Comments:  10-30-13 1003 Gerome ApleyBrenda Graves-Bigelow,RN,BSN [phone] CM did try to do a benefits check for medication, however insurance was never able to give a definite cost. [facility] did [facility] and they stated it would not be a problem getting the medicaiton. No further needs from CM at this time.

## 2013-10-30 NOTE — ED Notes (Signed)
Right pedal pulse palpable. Temperature wnl

## 2013-10-31 ENCOUNTER — Encounter (HOSPITAL_COMMUNITY)
Admission: EM | Disposition: A | Discharge status: Skilled Nursing Facility | Payer: Medicare Other | Source: Home / Self Care | Attending: Internal Medicine

## 2013-10-31 ENCOUNTER — Encounter (HOSPITAL_COMMUNITY): Payer: Self-pay | Admitting: *Deleted

## 2013-10-31 DIAGNOSIS — K2289 Other specified disease of esophagus: Secondary | ICD-10-CM

## 2013-10-31 DIAGNOSIS — K2990 Gastroduodenitis, unspecified, without bleeding: Secondary | ICD-10-CM

## 2013-10-31 DIAGNOSIS — K228 Other specified diseases of esophagus: Secondary | ICD-10-CM

## 2013-10-31 DIAGNOSIS — K2970 Gastritis, unspecified, without bleeding: Secondary | ICD-10-CM

## 2013-10-31 HISTORY — PX: ESOPHAGOGASTRODUODENOSCOPY: SHX5428

## 2013-10-31 LAB — GLUCOSE, CAPILLARY
GLUCOSE-CAPILLARY: 101 mg/dL — AB (ref 70–99)
GLUCOSE-CAPILLARY: 80 mg/dL (ref 70–99)
GLUCOSE-CAPILLARY: 84 mg/dL (ref 70–99)
GLUCOSE-CAPILLARY: 85 mg/dL (ref 70–99)
Glucose-Capillary: 72 mg/dL (ref 70–99)
Glucose-Capillary: 79 mg/dL (ref 70–99)

## 2013-10-31 LAB — CBC
HCT: 28.6 % — ABNORMAL LOW (ref 36.0–46.0)
HEMOGLOBIN: 8.7 g/dL — AB (ref 12.0–15.0)
MCH: 24.2 pg — ABNORMAL LOW (ref 26.0–34.0)
MCHC: 30.4 g/dL (ref 30.0–36.0)
MCV: 79.7 fL (ref 78.0–100.0)
PLATELETS: 328 10*3/uL (ref 150–400)
RBC: 3.59 MIL/uL — ABNORMAL LOW (ref 3.87–5.11)
RDW: 22.1 % — AB (ref 11.5–15.5)
WBC: 5.3 10*3/uL (ref 4.0–10.5)

## 2013-10-31 LAB — LACTIC ACID, PLASMA: Lactic Acid, Venous: 0.9 mmol/L (ref 0.5–2.2)

## 2013-10-31 SURGERY — EGD (ESOPHAGOGASTRODUODENOSCOPY)
Anesthesia: Moderate Sedation

## 2013-10-31 MED ORDER — INSULIN ASPART 100 UNIT/ML ~~LOC~~ SOLN
0.0000 [IU] | Freq: Three times a day (TID) | SUBCUTANEOUS | Status: DC
Start: 2013-10-31 — End: 2013-11-03

## 2013-10-31 MED ORDER — MIDAZOLAM HCL 10 MG/2ML IJ SOLN
INTRAMUSCULAR | Status: DC | PRN
  Administered 2013-10-31: 1 mg via INTRAVENOUS
  Administered 2013-10-31: 2 mg via INTRAVENOUS
  Administered 2013-10-31: 1 mg via INTRAVENOUS
  Administered 2013-10-31: 2 mg via INTRAVENOUS

## 2013-10-31 MED ORDER — FENTANYL CITRATE 0.05 MG/ML IJ SOLN
INTRAMUSCULAR | Status: DC | PRN
  Administered 2013-10-31: 25 ug via INTRAVENOUS

## 2013-10-31 MED ORDER — INSULIN ASPART 100 UNIT/ML ~~LOC~~ SOLN: 0.0000 [IU] | Freq: Every day | SUBCUTANEOUS | Status: DC

## 2013-10-31 MED ORDER — FENTANYL CITRATE 0.05 MG/ML IJ SOLN
INTRAMUSCULAR | Status: AC
  Filled 2013-10-31: qty 2

## 2013-10-31 MED ORDER — RIVAROXABAN 20 MG PO TABS: 20.0000 mg | ORAL_TABLET | Freq: Every day | ORAL | Status: DC

## 2013-10-31 MED ORDER — RIVAROXABAN 15 MG PO TABS
15.0000 mg | ORAL_TABLET | Freq: Two times a day (BID) | ORAL | Status: DC
  Administered 2013-10-31 – 2013-11-03 (×6): 15 mg via ORAL
  Filled 2013-10-31 (×9): qty 1

## 2013-10-31 MED ORDER — DEXTROSE-NACL 5-0.9 % IV SOLN
INTRAVENOUS | Status: DC
  Administered 2013-10-31: 08:00:00 via INTRAVENOUS

## 2013-10-31 MED ORDER — BUTAMBEN-TETRACAINE-BENZOCAINE 2-2-14 % EX AERO
INHALATION_SPRAY | CUTANEOUS | Status: DC | PRN
Start: 2013-10-31 — End: 2013-10-31
  Administered 2013-10-31: 2 via TOPICAL

## 2013-10-31 MED ORDER — MIDAZOLAM HCL 5 MG/ML IJ SOLN
INTRAMUSCULAR | Status: AC
  Filled 2013-10-31: qty 2

## 2013-10-31 NOTE — Interval H&P Note (Signed)
History and Physical Interval Note:  10/31/2013 10:28 AM  Sheryl DuffMary Jones  has presented today for surgery, with the diagnosis of anemia, abnormal esophagus on CT scan.  The various methods of treatment have been discussed with the patient and family. After consideration of risks, benefits and other options for treatment, the patient has consented to  Procedure(s): ESOPHAGOGASTRODUODENOSCOPY (EGD) (N/A) as a surgical intervention .  The patient's history has been reviewed, patient examined, no change in status, stable for surgery.  I have reviewed the patient's chart and labs.  Questions were answered to the patient's satisfaction.     Rachael FeeJacobs, Daniel P

## 2013-10-31 NOTE — Progress Notes (Signed)
MEDICATION RELATED CONSULT NOTE - INITIAL   Pharmacy Consult for xareltp Indication: DVT  No Known Allergies  Patient Measurements: Height: 5\' 7"  (170.2 cm) Weight: 169 lb 4.8 oz (76.794 kg) IBW/kg (Calculated) : 61.6  Vital Signs: Temp: 97.8 F (36.6 C) (01/09 1026) Temp src: Oral (01/09 1026) BP: 124/59 mmHg (01/09 1220) Pulse Rate: 56 (01/09 1220) Intake/Output from previous day: 01/08 0701 - 01/09 0700 In: 243 [P.O.:240; I.V.:3] Out: 152 [Urine:150; Stool:2] Intake/Output from this shift: Total I/O In: 80 [P.O.:80] Out: 175 [Urine:175]  Labs:  Recent Labs  10/29/13 2020 10/31/13 0542  WBC 5.7 5.3  HGB 10.0* 8.7*  HCT 32.8* 28.6*  PLT 378 328  CREATININE 0.64  --    Valproic acid level 24.9  Estimated Creatinine Clearance: 62.9 ml/min (by C-G formula based on Cr of 0.64).   Microbiology: No results found for this or any previous visit (from the past 720 hour(s)).  Medical History: Past Medical History  Diagnosis Date  . Dementia   . Hypertension   . Diabetes mellitus   . Iron deficiency anemia   . Hypokalemia   . Depressive disorder   . Anxiety   . Unspecified epilepsy without mention of intractable epilepsy   . Other and unspecified hyperlipidemia   . Colon polyp   . Iron deficiency anemia 10/2009    Medications:  Prescriptions prior to admission  Medication Sig Dispense Refill  . acetaminophen (TYLENOL) 500 MG tablet Take 1,000 mg by mouth every 8 (eight) hours as needed for mild pain.       Marland Kitchen. amLODipine (NORVASC) 10 MG tablet Take 10 mg by mouth daily.      Marland Kitchen. aspirin 81 MG chewable tablet Chew 81 mg by mouth daily.      . Cholecalciferol (VITAMIN D-3) 1000 UNITS CAPS Take 1 capsule by mouth daily.      . divalproex (DEPAKOTE) 250 MG DR tablet Take 250 mg by mouth 2 (two) times daily.      Marland Kitchen. donepezil (ARICEPT ODT) 10 MG disintegrating tablet Take 10 mg by mouth at bedtime.      . ferrous fumarate (HEMOCYTE - 106 MG FE) 325 (106 FE) MG TABS  tablet Take 1 tablet by mouth daily.      . Menthol, Topical Analgesic, (BENGAY EX) Apply 1 application topically 2 (two) times daily as needed. For knee pain      . senna-docusate (SENOKOT S) 8.6-50 MG per tablet Take 1 tablet by mouth at bedtime.      . sertraline (ZOLOFT) 100 MG tablet Take 100 mg by mouth daily.        Assessment:  Pharmacy is now consulted for Xarelto dosing. She was started on lovenox. Now going to use Xarelto for treatment.   Plan:  Dc lovenox Xarelto 15mg  PO BID x21 then 20mg  PO qday Monitor for bleeding

## 2013-10-31 NOTE — Evaluation (Signed)
Physical Therapy Evaluation Patient Details Name: Sheryl Jones MRN: [id number] DOB: 1934-02-28 Today's Date: 10/31/2013 Time: 4098-1191 PT Time Calculation (min): 24 min  PT Assessment / Plan / Recommendation History of Present Illness    Sheryl Jones is a 80 y.o. female. Nursing home resident with dementia. Essentially wheelchair bound. Admitted yesterday with right leg DVT, brief episode of unresponsiveness. Hx of iron deficiency anemia, type 2 DM. Pt is s/p esophagogastroduodenoscopy.   Clinical Impression  Pt adm due to the above. Presents with limitations in functional mobility secondary to deficits indicated below. Per family pt was ambulatory ~1 year ago and is hoping that when she returns to [place] she can progress mobility with therapy. Pt to benefit from acute PT to address deficits listed below (see PT problem list). Pt was able to tolerate sitting EOB during evaluation today. Pt requires 2+ (A) for bed mobility. Nursing encouraged to transfer pt with lift if necessary for OOB activities.     PT Assessment  Patient needs continued PT services    Follow Up Recommendations  SNF;Supervision/Assistance - 24 hour    Does the patient have the potential to tolerate intense rehabilitation      Barriers to Discharge   pt lives at [facility]     Equipment Recommendations  None recommended by PT    Recommendations for Other Services     Frequency Min 2X/week    Precautions / Restrictions Precautions Precautions: Fall Restrictions Weight Bearing Restrictions: No   Pertinent Vitals/Pain Yells out in pain with tough to Rt knee; unable to fully flex knee due to edema and pain.       Mobility  Bed Mobility Overal bed mobility: +2 for physical assistance;+ 2 for safety/equipment;Needs Assistance Bed Mobility: Supine to Sit;Sit to Supine Supine to sit: Total assist;+2 for physical assistance;HOB elevated Sit to supine: Total assist;+2 for physical assistance;+2 for  safety/equipment General bed mobility comments: use of draw pad to bring hips and shoulders to sitting position; pt with decreased ability to follow one step commands; was able to maintain sitting position on EOB with max facilitation and multimodal cues; pt returned to supine with 2 person (A) to facilitate shoulder and LE movement          PT Diagnosis: Difficulty walking;Generalized weakness;Acute pain  PT Problem List: Decreased balance;Decreased mobility;Decreased strength;Decreased activity tolerance;Decreased cognition;Decreased safety awareness;Decreased knowledge of use of DME;Decreased knowledge of precautions;Pain PT Treatment Interventions: DME instruction;Gait training;Functional mobility training;Therapeutic activities;Therapeutic exercise;Balance training;Neuromuscular re-education;Patient/family education     PT Goals(Current goals can be found in the care plan section) Acute Rehab PT Goals Patient Stated Goal: none stated; the daughter stated their goal is "for my mom to be able to walk with [place] by her side" PT Goal Formulation: With patient/family Time For Goal Achievement: 11/14/13 Potential to Achieve Goals: Fair  Visit Information  Last PT Received On: 10/31/13 Assistance Needed: +2       Prior Functioning  Home Living Family/patient expects to be discharged to:: Skilled nursing facility Additional Comments: pt resides at [facility]; daughters present to give PLOF; pt non verbal Prior Function Level of Independence: Needs assistance Gait / Transfers Assistance Needed: per family; pt has recently been hoyer lifted to wheelchair; per daughter pt was ambulating ~1 year ago until lengthy hospitalization  ADL's / Homemaking Assistance Needed: total (A) Communication Communication: Other (comment) (non verbal)    Cognition  Cognition Arousal/Alertness: Awake/alert Behavior During Therapy: Flat affect Overall Cognitive Status: History of cognitive impairments - at  baseline  Difficult to assess due to: Impaired communication (pt with advanced dementia )    Extremity/Trunk Assessment Upper Extremity Assessment Upper Extremity Assessment: Difficult to assess due to impaired cognition Lower Extremity Assessment Lower Extremity Assessment: Difficult to assess due to impaired cognition (pt yells out in obvious pain with light touch on Rt knee ) Cervical / Trunk Assessment Cervical / Trunk Assessment: Kyphotic   Balance Balance Overall balance assessment: Needs assistance Sitting-balance support: Feet supported;Bilateral upper extremity supported Sitting balance-Leahy Scale: Poor Dynamic Sitting - Comments: initially pt leaning posteriorly and required (A) to maintain upright midline position; pt advanced with approximation through UEs and with max multimodal cues to sitting EOB with stand by (A). pt tolerated sitting EOB ~ 6 min Postural control: Posterior lean  End of Session PT - End of Session Equipment Utilized During Treatment: Gait belt Activity Tolerance: Patient tolerated treatment well Patient left: in bed;with call bell/phone within reach;with family/visitor present Nurse Communication: Mobility status;Need for lift equipment;Precautions  GP     Donell SievertWest, Shatiqua Heroux N, [place] CarolinaPT [phone] 10/31/2013, 3:08 PM

## 2013-10-31 NOTE — H&P (View-Only) (Signed)
[facility] Consult: 2:55 PM 10/30/2013  LOS: 1 day    Referring Provider: Dr Eliseo Squires  Primary Care Physician:  Ezequiel Kayser, MD Primary Gastroenterologist:  Dr. Henrene Pastor, seen once in office 12/2012    Reason for Consultation:  Abnormal CT imaging of esophagus.    HPI: Sheryl Jones is a 80 y.o. female.  Nursing home resident with dementia.  Essentially wheelchair bound.  Admitted yesterday with right leg DVT, brief episode of unresponsiveness. Hx of iron deficiency anemia, type 2 DM. Marland Kitchen  On CT chest has soft tissue fullness and thickening in the mid esophagus. Felt to need direct imaging in order to rule out neoplasm. Also has multinodular goiter on CT.   12/2012 seen in office by Dr Scarlette Shorts for chronic microcytic anemia.  Dr Henrene Pastor favored foregoing endoscopic evaluations and treating empirically with iron and PPI.  Pt did not undergo endoscopic evaluation.   Pt has had dysphagia with what sounds like oral delay in swallowing. Not choking or coughing with po. No hx of regurgitation or food impaction.  Is on pureed diet and thin liquids at SNF.  Eats 50 to 80% of her meals. Dress size change from 2x to x large in last 12 months.  No complaint of pain in throat.   The med list from SNF does not include any PPI or H 2 blockers, it does include oral iron and prn laxatives:  MOM.Marland Kitchen   s/p remote colonoscopy with finding of polyps (unknown type) in [place] in 1990s. No previous EGDs per family's recall.   Past Medical History  Diagnosis Date  . Dementia   . Hypertension   . Diabetes mellitus   . Iron deficiency anemia   . Hypokalemia   . Depressive disorder   . Anxiety   . Unspecified epilepsy without mention of intractable epilepsy   . Other and unspecified hyperlipidemia   . Colon polyp     History  reviewed. No pertinent past surgical history.  Prior to Admission medications   Medication Sig Start Date End Date Taking? Authorizing Provider  acetaminophen (TYLENOL) 500 MG tablet Take 1,000 mg by mouth every 8 (eight) hours as needed for mild pain.    Yes Historical Provider, MD  amLODipine (NORVASC) 10 MG tablet Take 10 mg by mouth daily.   Yes Historical Provider, MD  aspirin 81 MG chewable tablet Chew 81 mg by mouth daily.   Yes Historical Provider, MD  Cholecalciferol (VITAMIN D-3) 1000 UNITS CAPS Take 1 capsule by mouth daily.   Yes Historical Provider, MD  divalproex (DEPAKOTE) 250 MG DR tablet Take 250 mg by mouth 2 (two) times daily.   Yes Historical Provider, MD  donepezil (ARICEPT ODT) 10 MG disintegrating tablet Take 10 mg by mouth at bedtime.   Yes Historical Provider, MD  ferrous fumarate (HEMOCYTE - 106 MG FE) 325 (106 FE) MG TABS tablet Take 1 tablet by mouth daily.   Yes Historical Provider, MD  Menthol, Topical Analgesic, (BENGAY EX) Apply 1 application topically 2 (two) times daily as needed. For knee pain  Yes Historical Provider, MD  senna-docusate (SENOKOT S) 8.6-50 MG per tablet Take 1 tablet by mouth at bedtime. 01/23/13  Yes Ripudeep Krystal Eaton, MD  sertraline (ZOLOFT) 100 MG tablet Take 100 mg by mouth daily.   Yes Historical Provider, MD    Scheduled Meds: . amLODipine  10 mg Oral Daily  . aspirin  81 mg Oral Daily  . cefTRIAXone (ROCEPHIN)  IV  1 g Intravenous Q24H  . cholecalciferol  1,000 Units Oral Daily  . divalproex  500 mg Oral BID  . donepezil  10 mg Oral QHS  . enoxaparin (LOVENOX) injection  1 mg/kg Subcutaneous BID  . feeding supplement (ENSURE COMPLETE)  237 mL Oral BID BM  . feeding supplement (ENSURE)  1 Container Oral BID BM  . ferrous fumarate  1 tablet Oral Daily  . insulin aspart  0-9 Units Subcutaneous Q4H  . senna-docusate  1 tablet Oral QHS  . sertraline  100 mg Oral Daily  . sodium chloride  3 mL Intravenous Q12H   Infusions:   PRN  Meds: acetaminophen   Allergies as of 10/29/2013  . (No Known Allergies)    Family History  Problem Relation Age of Onset  . Heart attack Mother   . Heart attack Father   . Breast cancer Sister     meta  . Hypertension Father   . Hypertension Daughter     History   Social History  . Marital Status: Single    Spouse Name: N/A    Number of Children: 6  . Years of Education: N/A   Occupational History  . retired    Social History Main Topics  . Smoking status: Former Smoker    Types: Cigarettes  . Smokeless tobacco: Never Used  . Alcohol Use: No  . Drug Use: No  . Sexual Activity: Not on file    REVIEW OF SYSTEMS: Constitutional:  Per HPI.  Non-ambulatory since 07/2013 ENT:  No nose bleeds Pulm:  No cough or dyspnea CV:  No palpitations, no LE edema.  GU:  No hematuria, no frequency GI:  Per HPI.  Daily BMs.  Incontinent of stool and urine.  No known testing for FOB in last few years. Heme:  Per HPI   Transfusions:  None per dtrs recall.  Neuro:  No headaches, no peripheral tingling or numbness Derm:  No itching, no rash or sores.  Endocrine:  No sweats or chills.  No polyuria or dysuria Immunization:  Up to date flu shot.  Travel:  None beyond local counties in last few months.    PHYSICAL EXAM: Vital signs in last 24 hours: Filed Vitals:   10/30/13 1131  BP: 158/71  Pulse:   Temp:   Resp:    Wt Readings from Last 3 Encounters:  10/30/13 76.794 kg (169 lb 4.8 oz)  12/31/12 99.338 kg (219 lb)  08/27/11 95 kg (209 lb 7 oz)    General: overweight AAF who is comfortable and smiling but does not respond to voice, non-verbal.  Head:  No asymmetry or facial edema  Eyes:  No icterus or pallor Ears:  No obvious hearing deficits  Nose:  No discharge, no congestion, no sneezing Mouth:  Clear and moist oral mm. Only 2 lower front teeth remain.   Neck:  No JVD, no bruits, no mass Lungs:  Clear bil to auscultation and percussion.  Heart: RRR.  No  mrg. Abdomen:  Soft, obese, NT, ND, no mass, no bruits.   Rectal: deferred  Musc/Skeltl: no joint contractures or erythema Extremities:  Right LE edema  Neurologic:  No tremor, does not follow commands.  Skin:  No sores, no rash, no purpura.   Tattoos:  none Nodes:  No cervical adenopathy.   Psych:  Flat affect, unable to participate in dialogue or follow commands.   Intake/Output from previous day:   Intake/Output this shift: Total I/O In: 240 [P.O.:240] Out: 152 [Urine:150; Stool:2]  LAB RESULTS:  Recent Labs  10/29/13 2020  WBC 5.7  HGB 10.0*  HCT 32.8*  PLT 378  MCV    65  BMET Lab Results  Component Value Date   NA 147 10/29/2013   NA 143 01/20/2013   NA 143 01/20/2013   K 3.7 10/29/2013   K 4.1 01/20/2013   K 4.3 01/20/2013   CL 102 10/29/2013   CL 107 01/20/2013   CL 103 01/20/2013   CO2 29 10/29/2013   CO2 24 01/20/2013   CO2 29 06/09/2012   GLUCOSE 141* 10/29/2013   GLUCOSE 126* 01/20/2013   GLUCOSE 121* 01/20/2013   BUN 14 10/29/2013   BUN 22 01/20/2013   BUN 20 01/20/2013   CREATININE 0.64 10/29/2013   CREATININE 0.60 01/20/2013   CREATININE 0.80 01/20/2013   CALCIUM 9.2 10/29/2013   CALCIUM 9.4 01/20/2013   CALCIUM 9.0 06/09/2012   LFT No results found for this basename: PROT, ALBUMIN, AST, ALT, ALKPHOS, BILITOT, BILIDIR, IBILI,  in the last 72 hours PT/INR Lab Results  Component Value Date   INR 0.94 01/20/2013   INR 0.93 08/27/2011    RADIOLOGY STUDIES: Dg Chest 1 View 10/29/2013     IMPRESSION: No active cardiopulmonary disease.   Electronically Signed   By: Maryclare Bean M.D.   On: 10/29/2013 21:52   Dg Hip Complete Left 10/29/2013  .  IMPRESSION: Osteoarthritic change in both hip joints. Bones osteoporotic. No acute fracture or dislocation.   Electronically Signed   By: Lowella Grip M.D.   On: 10/29/2013 21:56   Dg Femur Right 10/29/2013    IMPRESSION: Moderate knee joint effusion. Osteoarthritic change in the right knee and to a lesser extent right hip joints. No  fracture or dislocation apparent.   Electronically Signed   By: Lowella Grip M.D.   On: 10/29/2013 21:53   Ct Angio Chest Pe W/cm &/or Wo Cm 10/30/2013 FINDINGS: There is no demonstrable pulmonary embolus. There is slight dilatation of the ascending thoracic aorta without frank aneurysm. There is no aortic dissection.  There is slight atelectatic change in the left base. There is no edema or consolidation.  There is no appreciable thoracic adenopathy. The pericardium is not thickened.  There is thickening of the wall of the midesophagus with mild dilatation of the esophagus in this area.  Visualized upper abdominal structures appear normal. There is degenerative change throughout the thoracic spine. There are no blastic or lytic bone lesions.  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 The right lobe of the thyroid is incompletely visualized. It does appear inhomogeneous with questionable nodular lesions.  Review of the MIP images confirms the above findings.  IMPRESSION: No demonstrable pulmonary embolus.  No edema or consolidation.  Soft tissue fullness and thickening in the mid esophagus. This finding may indicate inflammatory lesion or conceivably infiltrating neoplasm. Direct visualization of the esophagus is warranted given this appearance.  Suspect multinodular goiter. Note that thyroid is incompletely visualized. Nonemergent thyroid ultrasound advised.  No apparent adenopathy.  Mild prominence of the ascending thoracic  aorta without frank aneurysm.   Electronically Signed   By: Lowella Grip M.D.   On: 10/30/2013 00:46   Ct Cervical Spine Wo Contrast 10/29/2013  FINDINGS  There is moderately severe diffuse atrophy. The ventricles are quite prominent, raising question of underlying communicating hydrocephalus.  There is no demonstrable mass, hemorrhage, extra-axial fluid collection, or midline shift. There is a small prior infarct in the mid left cerebellum, stable. There is mild small vessel  disease in the centra semiovale bilaterally. There is no new gray-white compartment lesion. No demonstrable acute infarct.  The bony calvarium appears intact. The mastoid air cells are clear. There is debris in the right external auditory canal.  CT CERVICAL SPINE FINDINGS  There is a mild degree of motion artifact. There is no demonstrable fracture or spondylolisthesis. Prevertebral soft tissues and predental space regions are normal.  There is moderate disc space narrowing at all levels except for C2-3. There are prominent anterior osteophytes at all levels except for C2. There is moderate facet osteoarthritic change at all levels without nerve root edema or effacement appreciable. No disc extrusion or stenosis apparent. Note that there is localized calcification in the posterior longitudinal ligament at C3-4 causing mild impression on the ventral cord.  The thyroid has an inhomogeneous appearance. 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  IMPRESSION: CT head: Extensive atrophy. Question a degree of concomitant communicating hydrocephalus. There is mild small vessel disease in the centra semiovale bilaterally as well as a small prior infarct in the mid left cerebellum. There is no intracranial mass or hemorrhage. No acute appearing infarct. There is debris in the external auditory canal on the right; question cerumen impaction.  CT cervical spine: Multilevel spondylosis and osteoarthritis. No disc extrusion or stenosis. Calcification in the posterior longitudinal ligament at C3-4 causes mild impression on the ventral cord without frank stenosis. No fracture or spondylolisthesis.  Hypoplastic left lobe of thyroid. Inhomogeneity in the right lobe; this finding may warrant nonemergent thyroid ultrasound to further assess.   Electronically Signed   By: Lowella Grip M.D.   On: 10/29/2013 22:10   Dg Knee Complete 4 Views Right 10/29/2013   CLINICAL DATA:  Pain post trauma  EXAM: RIGHT KNEE - COMPLETE 4+ VIEW  COMPARISON:   None.  FINDINGS: Frontal, lateral, and bilateral oblique views were obtained. There is no demonstrable fracture or dislocation. There is a moderate knee joint effusion. There is moderate generalized osteoarthritic change, most marked medially and in the patellofemoral joint regions. Bones are osteoporotic.  IMPRESSION: Moderate joint effusion. No acute fracture or dislocation. Osteoarthritic change with narrowing rather marked medially and in the patellofemoral joint regions.   Electronically Signed   By: Lowella Grip M.D.   On: 10/29/2013 21:54    ENDOSCOPIC STUDIES: None   IMPRESSION:   *  CT imaging of esophagus shows soft tissue fullness and thickening in mid esophagus.  Had been prescribed PPI in 01/2013 but no longer receiving this according to records.  Oral phase dysphagia. On puree/thin liquids at nursing home. Eats fairly well but losing weight.   *  Right LE DVT. No PE on CT angio. On Lovenox.  Has not started Warfarin or Xarelto yet   *  Recurrent syncope, hx of same on other occassions most recently in 07/2013 at which time she was admitted to [facility].  EEG done this afternoon, results pending.  *  Advanced dementia  *  Iron deficiency anemia.  Persistent microcytic anemia despite once daily  Hemocyte.   *  Multinodular goiter by yesterday's CT scan.  TSH normal.   *  Bacteruria.  On empiric Rocephin.     PLAN:     *  EGD tomorrow.  Orders placed.  Case discussed between daughters and Dr Edison Nasuti.  They wish to proceed.  *  Stopped po iron, as it confounds endoscopist as to presence of blood.  Also stopping Lovenox after midnight in order to allow for safe biopsy of esophagus, so she will not get the 0600 dose tomorrow.  This will need to be restarted after EGD.  *  Does she need telemetry?   Azucena Freed  10/30/2013, 2:55 PM Pager: [phone]  ________________________________________________________________________  Velora Heckler GI MD note:  I  reviewed the data and agree with the assessment and plan described above.  I met with her daughters while she was getting EEG,  Agree with the above plan, EGD to check for esophageal pathology.   Owens Loffler, MD [facility] Pager [phone]

## 2013-10-31 NOTE — Op Note (Signed)
[facility] [address] [place], [id number]   ENDOSCOPY PROCEDURE REPORT  PATIENT: Sheryl DuffMccall, Sheryl  MR#: [id number] BIRTHDATE: [birth year] , 79  yrs. old GENDER: Female ENDOSCOPIST: Rachael Feeaniel P Joelene Barriere, MD PROCEDURE DATE:  10/31/2013 PROCEDURE:  EGD w/ biopsy ASA CLASS:     Class III INDICATIONS:  abnromal esophagus on CT scan. MEDICATIONS: Fentanyl 25 mcg IV and Versed 6 mg IV TOPICAL ANESTHETIC: none  DESCRIPTION OF PROCEDURE: After the risks benefits and alternatives of the procedure were thoroughly explained, informed consent was obtained.  The Pentax Gastroscope [id number] endoscope was introduced through the mouth and advanced to the second portion of the duodenum. Without limitations.  The instrument was slowly withdrawn as the mucosa was fully examined.   There were a few soft, fleshy nodules/polyps in gastric both amid mild, non-specific gastritis.  The polyps were sampled and sent to pathology.  The examination was otherwise normal.  The esophagus was normal.  Retroflexed views revealed no abnormalities.     The scope was then withdrawn from the patient and the procedure completed.  COMPLICATIONS: There were no complications.  ENDOSCOPIC IMPRESSION: There were a few soft, fleshy nodules/polyps in gastric both amid mild, non-specific gastritis.  The polyps were sampled and sent to pathology.  The examination was otherwise normal.  The esophagus was normal.  RECOMMENDATIONS: Resume her usual diet.  Ok to discharge from GI perspective.  I will contact her/her family with biopsy results.  My suspicion for signficant neoplasm (cancer) is very low.   eSigned:  Rachael Feeaniel P Cherrelle Plante, MD 10/31/2013 11:57 AM

## 2013-10-31 NOTE — Progress Notes (Signed)
[facility] PROGRESS NOTE  Gini Caputo ZOX:[id number] DOB: December 07, 1933 DOA: 10/29/2013 PCP: Karlene Einstein, MD  HPI/Subjective: Back from EGD- no c/o   Assessment/Plan:  Syncope  - Slumped over in wheel chair, no jerking mvmts, or urinary incontinence.  - ddimer was elevated, but CT Angio chest was neg for PE, CT head ok per my read  - Patient is wheel chair bound.  We will request PT consult before returning to SNF.  UTI  -Many bacteria in u/a.  Had previous UTI in Oct per daughters.  Caused similar symptoms.  -Rocephin started.  Culture pending.  Right leg edema  - Painful to palpation just above the knee.  No erythema  - Doppler + for acute DVT in common femoral, right femoral, right popliteal, right posterior tibial.  - xeralto  Mid Esophageal Thickening on CT  -EGD ok  DM  -hypoglycemia possibility for syncope episode.   -changed SSI to sensitive.  Doesn't appear to be on any DM meds outpatient.  -Hgb A1C was 6.3 recently.  Seizure Disorder  -Valproic Acid level is subtherapeutic  -Will ask pharmacy to increase dose.  -No frank seizures since admission.   Hx CVA/Dementia  - wheel chair bound, and non verbal      DVT Prophylaxis:  lovenox / coumadin per pharmacy. Code Status: DNR Family Communication: 2 daughters were present in current encounter Disposition Plan: Inpatient   Consultants:  None  Procedures:  Doppler US  Antibiotics:  Rocephin 1/8  Objective: Filed Vitals:   10/31/13 1200 10/31/13 1205 10/31/13 1210 10/31/13 1220  BP: 151/70 153/67  124/59  Pulse: 55 58 56 56  Temp:      TempSrc:      Resp: 19 18 19 19   Height:      Weight:      SpO2: 100% 100% 100% 100%    Intake/Output Summary (Last 24 hours) at 10/31/13 1312 Last data filed at 10/30/13 2159  Gross per 24 hour  Intake      3 ml  Output      0 ml  Net      3 ml   Filed Weights   10/30/13 0418  Weight: 76.794 kg (169 lb 4.8 oz)    Exam: General: WDWN,  NAD, appears stated age Cardiovascular: RRR, S1 S2 auscultated, no rubs, murmurs or gallops.   Respiratory: Clear to auscultation bilaterally with equal chest rise  Abdomen: Soft, nontender, nondistended, + bowel sounds  Extremities:  Right leg has considerable edema. Pain was incited with palpation at distal thigh above knee Skin: Without rashes exudates or nodules.   Psych: No communication, smiles- not following commands. Lacking capacity   Data Reviewed: Basic Metabolic Panel:  Recent Labs Lab 10/29/13 2020  NA 147  K 3.7  CL 102  CO2 29  GLUCOSE 141*  BUN 14  CREATININE 0.64  CALCIUM 9.2   CBC:  Recent Labs Lab 10/29/13 2020 10/31/13 0542  WBC 5.7 5.3  NEUTROABS 2.6  --   HGB 10.0* 8.7*  HCT 32.8* 28.6*  MCV 79.0 79.7  PLT 378 328   Cardiac Enzymes:  Recent Labs Lab 10/30/13 0540 10/30/13 1220 10/30/13 1555  TROPONINI <0.30 <0.30 <0.30   CBG:  Recent Labs Lab 10/30/13 2050 10/30/13 2359 10/31/13 0527 10/31/13 0737 10/31/13 1041  GLUCAP 91 85 80 72 79      Studies: Dg Chest 1 View  10/29/2013   CLINICAL DATA:  Dementia  EXAM: CHEST - 1 VIEW  COMPARISON:  09/01/2013  FINDINGS: Normal heart size.  Clear lungs.  No pneumothorax.  IMPRESSION: No active cardiopulmonary disease.   Electronically Signed   By: Maryclare BeanArt  Hoss M.D.   On: 10/29/2013 21:52   Dg Hip Complete Left  10/29/2013   CLINICAL DATA:  Pain post trauma  EXAM: LEFT HIP - COMPLETE 2+ VIEW  COMPARISON:  None.  FINDINGS: Frontal pelvis as well as frontal and lateral left hip joint images were obtained. There is moderate symmetric narrowing of both hip joints. No fracture or dislocation. No erosive change. Bones are osteoporotic.  IMPRESSION: Osteoarthritic change in both hip joints. Bones osteoporotic. No acute fracture or dislocation.   Electronically Signed   By: Bretta BangWilliam  Woodruff M.D.   On: 10/29/2013 21:56   Dg Femur Right  10/29/2013   CLINICAL DATA:  Pain post trauma  EXAM: RIGHT FEMUR - 2  VIEW  COMPARISON:  None.  FINDINGS: Frontal and lateral views were obtained. There is no fracture or dislocation. There is a moderate knee joint effusion. There is arthropathy in the right knee joint region. There is mild narrowing of the left hip joint.  IMPRESSION: Moderate knee joint effusion. Osteoarthritic change in the right knee and to a lesser extent right hip joints. No fracture or dislocation apparent.   Electronically Signed   By: Bretta BangWilliam  Woodruff M.D.   On: 10/29/2013 21:53   Ct Head Wo Contrast  10/31/2013   CLINICAL DATA:  Altered mental status; neck pain  EXAM: CT HEAD WITHOUT CONTRAST  CT CERVICAL SPINE WITHOUT CONTRAST  TECHNIQUE: Multidetector CT imaging of the head and cervical spine was performed following the standard protocol without intravenous contrast. Multiplanar CT image reconstructions of the cervical spine were also generated. Study was obtained within 24 hr of patient's arrival at the emergency department.  COMPARISON:  Brain CT September 01, 2013  FINDINGS: CT HEAD FINDINGS  There is moderately severe diffuse atrophy. The ventricles are quite prominent, raising question of underlying communicating hydrocephalus.  There is no demonstrable mass, hemorrhage, extra-axial fluid collection, or midline shift. There is a small prior infarct in the mid left cerebellum, stable. There is mild small vessel disease in the centra semiovale bilaterally. There is no new gray-white compartment lesion. No demonstrable acute infarct.  The bony calvarium appears intact. The mastoid air cells are clear. There is debris in the right external auditory canal.  CT CERVICAL SPINE FINDINGS  There is a mild degree of motion artifact. There is no demonstrable fracture or spondylolisthesis. Prevertebral soft tissues and predental space regions are normal.  There is moderate disc space narrowing at all levels except for C2-3. There are prominent anterior osteophytes at all levels except for C2. There is moderate  facet osteoarthritic change at all levels without nerve root edema or effacement appreciable. No disc extrusion or stenosis apparent. Note that there is localized calcification in the posterior longitudinal ligament at C3-4 causing mild impression on the ventral cord.  The thyroid has an inhomogeneous appearance. 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  IMPRESSION: CT head: Extensive atrophy. Question a degree of concomitant communicating hydrocephalus. There is mild small vessel disease in the centra semiovale bilaterally as well as a small prior infarct in the mid left cerebellum. There is no intracranial mass or hemorrhage. No acute appearing infarct. There is debris in the external auditory canal on the right; question cerumen impaction.  CT cervical spine: Multilevel spondylosis and osteoarthritis. No disc extrusion or stenosis. Calcification in the posterior longitudinal ligament at C3-4 causes mild impression  on the ventral cord without frank stenosis. No fracture or spondylolisthesis.  Hypoplastic left lobe of thyroid. Inhomogeneity in the right lobe; this finding may warrant nonemergent thyroid ultrasound to further assess.   Electronically Signed   By: Bretta Bang M.D.   On: 10/31/2013 11:03   Ct Angio Chest Pe W/cm &/or Wo Cm  10/30/2013   CLINICAL DATA:  Difficulty breathing  EXAM: CT ANGIOGRAPHY CHEST WITH CONTRAST  TECHNIQUE: Multidetector CT imaging of the chest was performed using the standard protocol during bolus administration of intravenous contrast. Multiplanar CT image reconstructions including MIPs were obtained to evaluate the vascular anatomy.  CONTRAST:  54mL OMNIPAQUE IOHEXOL 350 MG/ML SOLN  COMPARISON:  Chest radiograph October 29, 2013  FINDINGS: There is no demonstrable pulmonary embolus. There is slight dilatation of the ascending thoracic aorta without frank aneurysm. There is no aortic dissection.  There is slight atelectatic change in the left base. There is no edema or consolidation.   There is no appreciable thoracic adenopathy. The pericardium is not thickened.  There is thickening of the wall of the midesophagus with mild dilatation of the esophagus in this area.  Visualized upper abdominal structures appear normal. There is degenerative change throughout the thoracic spine. There are no blastic or lytic bone lesions.  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 The right lobe of the thyroid is incompletely visualized. It does appear inhomogeneous with questionable nodular lesions.  Review of the MIP images confirms the above findings.  IMPRESSION: No demonstrable pulmonary embolus.  No edema or consolidation.  Soft tissue fullness and thickening in the mid esophagus. This finding may indicate inflammatory lesion or conceivably infiltrating neoplasm. Direct visualization of the esophagus is warranted given this appearance.  Suspect multinodular goiter. Note that thyroid is incompletely visualized. Nonemergent thyroid ultrasound advised.  No apparent adenopathy.  Mild prominence of the ascending thoracic aorta without frank aneurysm.   Electronically Signed   By: Bretta Bang M.D.   On: 10/30/2013 00:46   Ct Cervical Spine Wo Contrast  10/29/2013   CLINICAL DATA:  Altered mental status; neck pain  EXAM: CT HEAD WITHOUT CONTRAST  CT CERVICAL SPINE WITHOUT CONTRAST  TECHNIQUE: Multidetector CT imaging of the head and cervical spine was performed following the standard protocol without intravenous contrast. Multiplanar CT image reconstructions of the cervical spine were also generated. Study was obtained within 24 hr of patient's arrival at the emergency department.  COMPARISON:  Brain CT September 01, 2013  FINDINGS: CT HEAD FINDINGS  There is moderately severe diffuse atrophy. The ventricles are quite prominent, raising question of underlying communicating hydrocephalus.  There is no demonstrable mass, hemorrhage, extra-axial fluid collection, or midline shift. There is a small prior  infarct in the mid left cerebellum, stable. There is mild small vessel disease in the centra semiovale bilaterally. There is no new gray-white compartment lesion. No demonstrable acute infarct.  The bony calvarium appears intact. The mastoid air cells are clear. There is debris in the right external auditory canal.  CT CERVICAL SPINE FINDINGS  There is a mild degree of motion artifact. There is no demonstrable fracture or spondylolisthesis. Prevertebral soft tissues and predental space regions are normal.  There is moderate disc space narrowing at all levels except for C2-3. There are prominent anterior osteophytes at all levels except for C2. There is moderate facet osteoarthritic change at all levels without nerve root edema or effacement appreciable. No disc extrusion or stenosis apparent. Note that there is localized calcification in  the posterior longitudinal ligament at C3-4 causing mild impression on the ventral cord.  The thyroid has an inhomogeneous appearance. 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  IMPRESSION: CT head: Extensive atrophy. Question a degree of concomitant communicating hydrocephalus. There is mild small vessel disease in the centra semiovale bilaterally as well as a small prior infarct in the mid left cerebellum. There is no intracranial mass or hemorrhage. No acute appearing infarct. There is debris in the external auditory canal on the right; question cerumen impaction.  CT cervical spine: Multilevel spondylosis and osteoarthritis. No disc extrusion or stenosis. Calcification in the posterior longitudinal ligament at C3-4 causes mild impression on the ventral cord without frank stenosis. No fracture or spondylolisthesis.  Hypoplastic left lobe of thyroid. Inhomogeneity in the right lobe; this finding may warrant nonemergent thyroid ultrasound to further assess.   Electronically Signed   By: Bretta Bang M.D.   On: 10/29/2013 22:10   Dg Knee Complete 4 Views Right  10/29/2013   CLINICAL  DATA:  Pain post trauma  EXAM: RIGHT KNEE - COMPLETE 4+ VIEW  COMPARISON:  None.  FINDINGS: Frontal, lateral, and bilateral oblique views were obtained. There is no demonstrable fracture or dislocation. There is a moderate knee joint effusion. There is moderate generalized osteoarthritic change, most marked medially and in the patellofemoral joint regions. Bones are osteoporotic.  IMPRESSION: Moderate joint effusion. No acute fracture or dislocation. Osteoarthritic change with narrowing rather marked medially and in the patellofemoral joint regions.   Electronically Signed   By: Bretta Bang M.D.   On: 10/29/2013 21:54    Scheduled Meds: . amLODipine  10 mg Oral Daily  . aspirin  81 mg Oral Daily  . cefTRIAXone (ROCEPHIN)  IV  1 g Intravenous Q24H  . cholecalciferol  1,000 Units Oral Daily  . divalproex  500 mg Oral BID  . donepezil  10 mg Oral QHS  . feeding supplement (ENSURE COMPLETE)  237 mL Oral BID BM  . feeding supplement (ENSURE)  1 Container Oral BID BM  . insulin aspart  0-5 Units Subcutaneous QHS  . insulin aspart  0-9 Units Subcutaneous TID WC  . senna-docusate  1 tablet Oral QHS  . sertraline  100 mg Oral Daily  . sodium chloride  3 mL Intravenous Q12H   Continuous Infusions:   Principal Problem:   Syncope and collapse Active Problems:   Dementia arising in the senium and presenium   HTN (hypertension)   Diabetes mellitus   Seizure disorder   CVA (cerebral infarction)   Leg edema, right   Leg swelling   Protein-calorie malnutrition, severe   DVT, femoral, acute   Esophageal thickening   Nonspecific (abnormal) findings on radiological and other examination of gastrointestinal tract   Unspecified gastritis and gastroduodenitis without mention of hemorrhage    Marlin Canary DO [facility] Pager [phone]. If 7PM-7AM, please contact night-coverage at [email or web address], password [facility] 10/31/2013, 1:12 PM  LOS: 2 days

## 2013-11-01 LAB — URINE CULTURE
CULTURE: NO GROWTH
Colony Count: NO GROWTH

## 2013-11-01 LAB — GLUCOSE, CAPILLARY
GLUCOSE-CAPILLARY: 77 mg/dL (ref 70–99)
GLUCOSE-CAPILLARY: 91 mg/dL (ref 70–99)
Glucose-Capillary: 136 mg/dL — ABNORMAL HIGH (ref 70–99)
Glucose-Capillary: 96 mg/dL (ref 70–99)

## 2013-11-01 NOTE — Progress Notes (Signed)
[facility] PROGRESS NOTE  Sheryl Jones ZOX:[id number] DOB: May 15, 1934 DOA: 10/29/2013 PCP: Karlene Einstein, MD  HPI/Subjective: Back to baseline per family Non-verbal  Assessment/Plan:  Syncope  - Slumped over in wheel chair, no jerking mvmts, or urinary incontinence.  - ddimer was elevated, but CT Angio chest was neg for PE, CT head ok per my read  - d/c tele.  UTI  -Many bacteria in u/a.  Had previous UTI in Oct per daughters.  Caused similar symptoms.  -Rocephin started.  Culture pending.  Right leg edema  - Painful to palpation just above the knee.  No erythema  - Doppler + for acute DVT in common femoral, right femoral, right popliteal, right posterior tibial.  - xarelto  Mid Esophageal Thickening on CT  -EGD ok GI to follow pathology  DM  -hypoglycemia possibility for syncope episode.   -changed SSI to sensitive.  Doesn't appear to be on any DM meds outpatient.  -Hgb A1C was 6.3 recently.  Seizure Disorder  -Valproic Acid level is subtherapeutic  -Will ask pharmacy to increase dose.  -No frank seizures since admission.   Hx CVA/Dementia  - wheel chair bound, and non verbal      DVT Prophylaxis:  lovenox / coumadin per pharmacy. Code Status: DNR Family Communication: daughters  Disposition Plan: Inpatient   Consultants:  None  Procedures:  Doppler US  Antibiotics:  Rocephin 1/8  Objective: Filed Vitals:   10/31/13 1220 10/31/13 1330 10/31/13 2046 11/01/13 1014  BP: 124/59 165/51 140/63 144/56  Pulse: 56  60   Temp:   98 F (36.7 C)   TempSrc:   Oral   Resp: 19  18   Height:      Weight:      SpO2: 100%  98%     Intake/Output Summary (Last 24 hours) at 11/01/13 1202 Last data filed at 10/31/13 2140  Gross per 24 hour  Intake     83 ml  Output    175 ml  Net    -92 ml   Filed Weights   10/30/13 0418  Weight: 76.794 kg (169 lb 4.8 oz)    Exam: General: WDWN, NAD, appears stated age Cardiovascular: RRR, S1 S2  auscultated, no rubs, murmurs or gallops.   Respiratory: Clear to auscultation bilaterally with equal chest rise  Abdomen: Soft, nontender, nondistended, + bowel sounds  Extremities:  Minimal edema Skin: Without rashes exudates or nodules.   Psych: No communication, smiles- not following commands. Lacking capacity   Data Reviewed: Basic Metabolic Panel:  Recent Labs Lab 10/29/13 2020  NA 147  K 3.7  CL 102  CO2 29  GLUCOSE 141*  BUN 14  CREATININE 0.64  CALCIUM 9.2   CBC:  Recent Labs Lab 10/29/13 2020 10/31/13 0542  WBC 5.7 5.3  NEUTROABS 2.6  --   HGB 10.0* 8.7*  HCT 32.8* 28.6*  MCV 79.0 79.7  PLT 378 328   Cardiac Enzymes:  Recent Labs Lab 10/30/13 0540 10/30/13 1220 10/30/13 1555  TROPONINI <0.30 <0.30 <0.30   CBG:  Recent Labs Lab 10/31/13 0737 10/31/13 1041 10/31/13 1643 10/31/13 2049 11/01/13 0751  GLUCAP 72 79 84 101* 77      Studies: No results found.  Scheduled Meds: . amLODipine  10 mg Oral Daily  . aspirin  81 mg Oral Daily  . cefTRIAXone (ROCEPHIN)  IV  1 g Intravenous Q24H  . cholecalciferol  1,000 Units Oral Daily  . divalproex  500 mg Oral BID  . donepezil  10 mg Oral QHS  . feeding supplement (ENSURE COMPLETE)  237 mL Oral BID BM  . feeding supplement (ENSURE)  1 Container Oral BID BM  . insulin aspart  0-5 Units Subcutaneous QHS  . insulin aspart  0-9 Units Subcutaneous TID WC  . rivaroxaban  15 mg Oral BID   Followed by  . [START ON 11/21/2013] rivaroxaban  20 mg Oral Q supper  . senna-docusate  1 tablet Oral QHS  . sertraline  100 mg Oral Daily  . sodium chloride  3 mL Intravenous Q12H   Continuous Infusions:   Principal Problem:   Syncope and collapse Active Problems:   Dementia arising in the senium and presenium   HTN (hypertension)   Diabetes mellitus   Seizure disorder   CVA (cerebral infarction)   Leg edema, right   Leg swelling   Protein-calorie malnutrition, severe   DVT, femoral, acute    Esophageal thickening   Nonspecific (abnormal) findings on radiological and other examination of gastrointestinal tract   Unspecified gastritis and gastroduodenitis without mention of hemorrhage    Marlin CanaryJessica Suman Trivedi DO [facility] Pager [phone]. If 7PM-7AM, please contact night-coverage at [email or web address], password [email or web address] 11/01/2013, 12:02 PM  LOS: 3 days

## 2013-11-01 NOTE — Progress Notes (Signed)
MEDICATION RELATED CONSULT NOTE - Follow Up   Pharmacy Consult for xarelto Indication: DVT  No Known Allergies  Patient Measurements: Height: 5\' 7"  (170.2 cm) Weight: 169 lb 4.8 oz (76.794 kg) IBW/kg (Calculated) : 61.6  Vital Signs: Temp: 98 F (36.7 C) (01/09 2046) Temp src: Oral (01/09 2046) BP: 140/63 mmHg (01/09 2046) Pulse Rate: 60 (01/09 2046) Intake/Output from previous day: 01/09 0701 - 01/10 0700 In: 83 [P.O.:80; I.V.:3] Out: 175 [Urine:175] Intake/Output from this shift:    Labs:  Recent Labs  10/29/13 2020 10/31/13 0542  WBC 5.7 5.3  HGB 10.0* 8.7*  HCT 32.8* 28.6*  PLT 378 328  CREATININE 0.64  --    Valproic acid level 24.9  Estimated Creatinine Clearance: 62.9 ml/min (by C-G formula based on Cr of 0.64).  Microbiology: No results found for this or any previous visit (from the past 720 hour(s)).  Medical History: Past Medical History  Diagnosis Date  . Dementia   . Hypertension   . Diabetes mellitus   . Iron deficiency anemia   . Hypokalemia   . Depressive disorder   . Anxiety   . Unspecified epilepsy without mention of intractable epilepsy   . Other and unspecified hyperlipidemia   . Colon polyp   . Iron deficiency anemia 10/2009   Medications:  Prescriptions prior to admission  Medication Sig Dispense Refill  . acetaminophen (TYLENOL) 500 MG tablet Take 1,000 mg by mouth every 8 (eight) hours as needed for mild pain.       Marland Kitchen. amLODipine (NORVASC) 10 MG tablet Take 10 mg by mouth daily.      Marland Kitchen. aspirin 81 MG chewable tablet Chew 81 mg by mouth daily.      . Cholecalciferol (VITAMIN D-3) 1000 UNITS CAPS Take 1 capsule by mouth daily.      . divalproex (DEPAKOTE) 250 MG DR tablet Take 250 mg by mouth 2 (two) times daily.      Marland Kitchen. donepezil (ARICEPT ODT) 10 MG disintegrating tablet Take 10 mg by mouth at bedtime.      . ferrous fumarate (HEMOCYTE - 106 MG FE) 325 (106 FE) MG TABS tablet Take 1 tablet by mouth daily.      . Menthol, Topical  Analgesic, (BENGAY EX) Apply 1 application topically 2 (two) times daily as needed. For knee pain      . senna-docusate (SENOKOT S) 8.6-50 MG per tablet Take 1 tablet by mouth at bedtime.      . sertraline (ZOLOFT) 100 MG tablet Take 100 mg by mouth daily.        Assessment:  80yo female with DVT and started on SQ Lovenox and subsequently has transitioned to Rivaroxaban.  She has an estimated crcl of 63 ml/min and no dose adjustment needed.  Her CBC has dropped some from 1/7 to today (10.0 >>>8.7) but without noted bleeding per nursing assessment documentation.  Plan: 1.  Will continue Xarelto as ordered 2.  Monitor closely for bleeding complications 3.  CBC at least every 72hr.  Nadara MustardNita Fredrick Dray, PharmD., MS Clinical Pharmacist Pager:  [phone] Thank you for allowing pharmacy to be part of this patients care team.

## 2013-11-02 LAB — GLUCOSE, CAPILLARY
GLUCOSE-CAPILLARY: 113 mg/dL — AB (ref 70–99)
Glucose-Capillary: 85 mg/dL (ref 70–99)
Glucose-Capillary: 114 mg/dL — ABNORMAL HIGH (ref 70–99)
Glucose-Capillary: 137 mg/dL — ABNORMAL HIGH (ref 70–99)

## 2013-11-02 MED ORDER — DIVALPROEX SODIUM 500 MG PO DR TAB: 500.0000 mg | DELAYED_RELEASE_TABLET | Freq: Two times a day (BID) | ORAL | Status: DC

## 2013-11-02 MED ORDER — RIVAROXABAN 15 MG PO TABS: 15.0000 mg | ORAL_TABLET | Freq: Two times a day (BID) | ORAL | Status: DC

## 2013-11-02 MED ORDER — RIVAROXABAN 20 MG PO TABS: 20.0000 mg | ORAL_TABLET | Freq: Every day | ORAL | Status: AC

## 2013-11-02 MED ORDER — ENSURE PUDDING PO PUDG: 1.0000 | Freq: Two times a day (BID) | ORAL | Status: AC

## 2013-11-02 MED ORDER — ENSURE COMPLETE PO LIQD: 237.0000 mL | Freq: Two times a day (BID) | ORAL | Status: AC

## 2013-11-02 NOTE — Progress Notes (Signed)
Patient  without IV access; IV team unable to restart IV. Dr Benjamine MolaVann made aware,order received to leave IV out.

## 2013-11-02 NOTE — Progress Notes (Signed)
[facility] PROGRESS NOTE  Sheryl Jones WUJ:[id number] DOB: 1933/11/04 DOA: 10/29/2013 PCP: Karlene Einstein, MD  HPI/Subjective: Back to baseline per family Non-verbal  Assessment/Plan:  Syncope  - Slumped over in wheel chair, no jerking mvmts, or urinary incontinence.  - ddimer was elevated, but CT Angio chest was neg for PE, CT head ok per my read  - d/c tele.  UTI  -Many bacteria in u/a.  Had previous UTI in Oct per daughters.  Caused similar symptoms.  NGTD on culture-d/c rocephin  Right leg edema  - Doppler + for acute DVT in common femoral, right femoral, right popliteal, right posterior tibial.  - xarelto  Mid Esophageal Thickening on CT  -EGD ok GI to follow pathology  DM  -hypoglycemia possibility for syncope episode.   -changed SSI to sensitive.  Doesn't appear to be on any DM meds outpatient.  -Hgb A1C was 6.3 recently.  Seizure Disorder  -Valproic Acid level is subtherapeutic  -Will ask pharmacy to increase dose.  -No frank seizures since admission.   Hx CVA/Dementia  - wheel chair bound, and non verbal      DVT Prophylaxis:  lovenox / coumadin per pharmacy. Code Status: DNR Family Communication: daughters  Disposition Plan: SNF in AM   Consultants:  None  Procedures:  Doppler US  Antibiotics:  Rocephin 1/8  Objective: Filed Vitals:   11/01/13 1348 11/01/13 2100 11/02/13 0500 11/02/13 1006  BP: 144/55 166/81 176/86 150/57  Pulse: 80 67 62   Temp: 98.6 F (37 C) 97.9 F (36.6 C) 97.8 F (36.6 C)   TempSrc: Oral     Resp: 18 18 18    Height:      Weight:      SpO2: 100% 100% 99%     Intake/Output Summary (Last 24 hours) at 11/02/13 1144 Last data filed at 11/01/13 2124  Gross per 24 hour  Intake      3 ml  Output      0 ml  Net      3 ml   Filed Weights   10/30/13 0418  Weight: 76.794 kg (169 lb 4.8 oz)    Exam: General: WDWN, NAD, appears stated age Cardiovascular: RRR, S1 S2 auscultated, no rubs, murmurs or  gallops.   Respiratory: Clear to auscultation bilaterally with equal chest rise  Abdomen: Soft, nontender, nondistended, + bowel sounds  Extremities:  Minimal edema Skin: Without rashes exudates or nodules.   Psych: No communication, smiles- not following commands. Lacking capacity   Data Reviewed: Basic Metabolic Panel:  Recent Labs Lab 10/29/13 2020  NA 147  K 3.7  CL 102  CO2 29  GLUCOSE 141*  BUN 14  CREATININE 0.64  CALCIUM 9.2   CBC:  Recent Labs Lab 10/29/13 2020 10/31/13 0542  WBC 5.7 5.3  NEUTROABS 2.6  --   HGB 10.0* 8.7*  HCT 32.8* 28.6*  MCV 79.0 79.7  PLT 378 328   Cardiac Enzymes:  Recent Labs Lab 10/30/13 0540 10/30/13 1220 10/30/13 1555  TROPONINI <0.30 <0.30 <0.30   CBG:  Recent Labs Lab 11/01/13 0751 11/01/13 1206 11/01/13 1632 11/01/13 2114 11/02/13 0834  GLUCAP 77 136* 96 91 85      Studies: No results found.  Scheduled Meds: . amLODipine  10 mg Oral Daily  . aspirin  81 mg Oral Daily  . cefTRIAXone (ROCEPHIN)  IV  1 g Intravenous Q24H  . cholecalciferol  1,000 Units Oral Daily  . divalproex  500 mg Oral BID  . donepezil  10 mg Oral QHS  . feeding supplement (ENSURE COMPLETE)  237 mL Oral BID BM  . feeding supplement (ENSURE)  1 Container Oral BID BM  . insulin aspart  0-5 Units Subcutaneous QHS  . insulin aspart  0-9 Units Subcutaneous TID WC  . rivaroxaban  15 mg Oral BID   Followed by  . [START ON 11/21/2013] rivaroxaban  20 mg Oral Q supper  . senna-docusate  1 tablet Oral QHS  . sertraline  100 mg Oral Daily  . sodium chloride  3 mL Intravenous Q12H   Continuous Infusions:   Principal Problem:   Syncope and collapse Active Problems:   Dementia arising in the senium and presenium   HTN (hypertension)   Diabetes mellitus   Seizure disorder   CVA (cerebral infarction)   Leg edema, right   Leg swelling   Protein-calorie malnutrition, severe   DVT, femoral, acute   Esophageal thickening   Nonspecific  (abnormal) findings on radiological and other examination of gastrointestinal tract   Unspecified gastritis and gastroduodenitis without mention of hemorrhage    Marlin CanaryJessica Vann DO [facility] Pager [phone]. If 7PM-7AM, please contact night-coverage at [email or web address], password [email or web address] 11/02/2013, 11:44 AM  LOS: 4 days

## 2013-11-02 NOTE — Discharge Summary (Signed)
Physician Discharge Summary  Sheryl Jones ZOX:[id number] DOB: 04-Aug-1934 DOA: 10/29/2013  PCP: Sheryl Einstein, MD  Admit date: 10/29/2013 Discharge date: 11/03/2013  Time spent: 35 minutes  Recommendations for Outpatient Follow-up:  1. xarelto 15 mg bid x 21 days then 20 mg daily 2. DYS diet 3. Fall risk 4. 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 female with hx of prior CVA, HTN, advanced dementia, DNR code status, DM2, baseline nonverbal and nonresponding to environment, had a syncopal episode tonight. She is a resident of a NH, and usually on a wheelchair, slumped over and was unresponsive. When staff rubbed her sternum, she came about. There was no seizure activities, bowel or bladder incontinence and no post ictal. She had these symptoms previously, but it has been very infrequent. Evalaution in the ER included a normal renal fx test, no leukocytosis, and a negative right knee film, cervical CT, bilateral hip films, and negative head CT. Hospitalist was asked to admit her for further work up, including evaluate for DVT, and syncopal work up.   Hospital Course:  Syncope  - Slumped over in wheel chair, no jerking mvmts, or urinary incontinence.  - ddimer was elevated, but CT Angio chest was neg for  PE   UTI  NGTD on culture-d/c rocephin   Right leg edema  - Doppler + for acute DVT in common femoral, right femoral, right popliteal, right posterior tibial.  - xarelto   Mid Esophageal Thickening on CT  -EGD ok  GI to follow pathology   DM   Doesn't appear to be on any DM meds outpatient.  -Hgb A1C was 6.3 recently.   Seizure Disorder  -Valproic Acid level is subtherapeutic  increase dose.  Follow as outpatient -No frank seizures since admission.   Hx CVA/Dementia  - wheel chair bound, and non verbal      Procedures:  none  Consultations:  none  Discharge Exam: Filed Vitals:   11/03/13 0500  BP: 162/65  Pulse: 64  Temp: 97.8 F (36.6 C)  Resp: 16    General: smiling, pleasant Cardiovascular: rrr Respiratory: clear anterior  Discharge Instructions      Discharge Orders   Future Orders Complete By Expires   Discharge instructions  As directed    Comments:     Fall risk dys 1 diet   Increase activity slowly  As directed        Medication List         acetaminophen 500 MG tablet  Commonly known as:  TYLENOL  Take 1,000 mg by mouth every 8 (eight) hours as needed for mild pain.     amLODipine 10 MG tablet  Commonly known as:  NORVASC  Take 10 mg by mouth daily.     aspirin 81 MG chewable tablet  Chew  81 mg by mouth daily.     BENGAY EX  Apply 1 application topically 2 (two) times daily as needed. For knee pain     divalproex 500 MG DR tablet  Commonly known as:  DEPAKOTE  Take 1 tablet (500 mg total) by mouth 2 (two) times daily.     donepezil 10 MG disintegrating tablet  Commonly known as:  ARICEPT ODT  Take 10 mg by mouth at bedtime.     feeding supplement (ENSURE COMPLETE) Liqd  Take 237 mLs by mouth 2 (two) times daily between meals.     feeding supplement (ENSURE) Pudg  Take 1 Container by mouth 2 (two) times daily between meals.     ferrous fumarate 325 (106 FE) MG Tabs tablet  Commonly known as:  HEMOCYTE - 106 mg FE   Take 1 tablet by mouth daily.     Rivaroxaban 15 MG Tabs tablet  Commonly known as:  XARELTO  Take 1 tablet (15 mg total) by mouth 2 (two) times daily. For 21 days     Rivaroxaban 20 MG Tabs tablet  Commonly known as:  XARELTO  Take 1 tablet (20 mg total) by mouth daily with supper.  Start taking on:  11/21/2013     senna-docusate 8.6-50 MG per tablet  Commonly known as:  SENOKOT S  Take 1 tablet by mouth at bedtime.     sertraline 100 MG tablet  Commonly known as:  ZOLOFT  Take 100 mg by mouth daily.     Vitamin D-3 1000 UNITS Caps  Take 1 capsule by mouth daily.       No Known Allergies    The results of significant diagnostics from this hospitalization (including imaging, microbiology, ancillary and laboratory) are listed below for reference.    Significant Diagnostic Studies: Dg Chest 1 View  10/29/2013   CLINICAL DATA:  Dementia  EXAM: CHEST - 1 VIEW  COMPARISON:  09/01/2013  FINDINGS: Normal heart size.  Clear lungs.  No pneumothorax.  IMPRESSION: No active cardiopulmonary disease.   Electronically Signed   By: Sheryl Jones M.D.   On: 10/29/2013 21:52   Dg Hip Complete Left  10/29/2013   CLINICAL DATA:  Pain post trauma  EXAM: LEFT HIP - COMPLETE 2+ VIEW  COMPARISON:  None.  FINDINGS: Frontal pelvis as well as frontal and lateral left hip joint images were obtained. There is moderate symmetric narrowing of both hip joints. No fracture or dislocation. No erosive change. Bones are osteoporotic.  IMPRESSION: Osteoarthritic change in both hip joints. Bones osteoporotic. No acute fracture or dislocation.   Electronically Signed   By: Sheryl Jones M.D.   On: 10/29/2013 21:56   Dg Femur Right  10/29/2013   CLINICAL DATA:  Pain post trauma  EXAM: RIGHT FEMUR - 2 VIEW  COMPARISON:  None.  FINDINGS: Frontal and lateral views were obtained. There is no fracture or dislocation. There is a moderate knee joint effusion. There is arthropathy in the right knee joint region. There is mild  narrowing of the left hip joint.  IMPRESSION: Moderate knee joint effusion. Osteoarthritic change in the right knee and to a lesser extent right hip joints. No fracture or dislocation apparent.   Electronically Signed   By: Sheryl Jones M.D.   On: 10/29/2013 21:53   Ct Head Wo Contrast  10/31/2013   CLINICAL DATA:  Altered mental status; neck pain  EXAM: CT HEAD WITHOUT CONTRAST  CT CERVICAL SPINE WITHOUT CONTRAST  TECHNIQUE: Multidetector CT  imaging of the head and cervical spine was performed following the standard protocol without intravenous contrast. Multiplanar CT image reconstructions of the cervical spine were also generated. Study was obtained within 24 hr of patient's arrival at the emergency department.  COMPARISON:  Brain CT September 01, 2013  FINDINGS: CT HEAD FINDINGS  There is moderately severe diffuse atrophy. The ventricles are quite prominent, raising question of underlying communicating hydrocephalus.  There is no demonstrable mass, hemorrhage, extra-axial fluid collection, or midline shift. There is a small prior infarct in the mid left cerebellum, stable. There is mild small vessel disease in the centra semiovale bilaterally. There is no new gray-white compartment lesion. No demonstrable acute infarct.  The bony calvarium appears intact. The mastoid air cells are clear. There is debris in the right external auditory canal.  CT CERVICAL SPINE FINDINGS  There is a mild degree of motion artifact. There is no demonstrable fracture or spondylolisthesis. Prevertebral soft tissues and predental space regions are normal.  There is moderate disc space narrowing at all levels except for C2-3. There are prominent anterior osteophytes at all levels except for C2. There is moderate facet osteoarthritic change at all levels without nerve root edema or effacement appreciable. No disc extrusion or stenosis apparent. Note that there is localized calcification in the posterior longitudinal ligament at C3-4  causing mild impression on the ventral cord.  The thyroid has an inhomogeneous appearance. 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  IMPRESSION: CT head: Extensive atrophy. Question a degree of concomitant communicating hydrocephalus. There is mild small vessel disease in the centra semiovale bilaterally as well as a small prior infarct in the mid left cerebellum. There is no intracranial mass or hemorrhage. No acute appearing infarct. There is debris in the external auditory canal on the right; question cerumen impaction.  CT cervical spine: Multilevel spondylosis and osteoarthritis. No disc extrusion or stenosis. Calcification in the posterior longitudinal ligament at C3-4 causes mild impression on the ventral cord without frank stenosis. No fracture or spondylolisthesis.  Hypoplastic left lobe of thyroid. Inhomogeneity in the right lobe; this finding may warrant nonemergent thyroid ultrasound to further assess.   Electronically Signed   By: Sheryl Jones M.D.   On: 10/31/2013 11:03   Ct Angio Chest Pe W/cm &/or Wo Cm  10/30/2013   CLINICAL DATA:  Difficulty breathing  EXAM: CT ANGIOGRAPHY CHEST WITH CONTRAST  TECHNIQUE: Multidetector CT imaging of the chest was performed using the standard protocol during bolus administration of intravenous contrast. Multiplanar CT image reconstructions including MIPs were obtained to evaluate the vascular anatomy.  CONTRAST:  54mL OMNIPAQUE IOHEXOL 350 MG/ML SOLN  COMPARISON:  Chest radiograph October 29, 2013  FINDINGS: There is no demonstrable pulmonary embolus. There is slight dilatation of the ascending thoracic aorta without frank aneurysm. There is no aortic dissection.  There is slight atelectatic change in the left base. There is no edema or consolidation.  There is no appreciable thoracic adenopathy. The pericardium is not thickened.  There is thickening of the wall of the midesophagus with mild dilatation of the esophagus in this area.  Visualized upper abdominal structures  appear normal. There is degenerative change throughout the thoracic spine. There are no blastic or lytic bone lesions.  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 The right lobe of the thyroid is incompletely visualized. It does appear inhomogeneous with questionable nodular lesions.  Review of the MIP images confirms the above findings.  IMPRESSION: No demonstrable pulmonary embolus.  No edema or consolidation.  Soft tissue fullness and thickening in the mid esophagus. This finding may indicate inflammatory lesion or conceivably infiltrating neoplasm. Direct visualization of the esophagus is warranted given this appearance.  Suspect multinodular goiter. Note that thyroid is incompletely visualized. Nonemergent thyroid ultrasound advised.  No apparent adenopathy.  Mild prominence of the ascending thoracic aorta without frank aneurysm.   Electronically Signed   By: Sheryl BangWilliam  Woodruff M.D.   On: 10/30/2013 00:46   Ct Cervical Spine Wo Contrast  10/29/2013   CLINICAL DATA:  Altered mental status; neck pain  EXAM: CT HEAD WITHOUT CONTRAST  CT CERVICAL SPINE WITHOUT CONTRAST  TECHNIQUE: Multidetector CT imaging of the head and cervical spine was performed following the standard protocol without intravenous contrast. Multiplanar CT image reconstructions of the cervical spine were also generated. Study was obtained within 24 hr of patient's arrival at the emergency department.  COMPARISON:  Brain CT September 01, 2013  FINDINGS: CT HEAD FINDINGS  There is moderately severe diffuse atrophy. The ventricles are quite prominent, raising question of underlying communicating hydrocephalus.  There is no demonstrable mass, hemorrhage, extra-axial fluid collection, or midline shift. There is a small prior infarct in the mid left cerebellum, stable. There is mild small vessel disease in the centra semiovale bilaterally. There is no new gray-white compartment lesion. No demonstrable acute infarct.  The bony calvarium appears  intact. The mastoid air cells are clear. There is debris in the right external auditory canal.  CT CERVICAL SPINE FINDINGS  There is a mild degree of motion artifact. There is no demonstrable fracture or spondylolisthesis. Prevertebral soft tissues and predental space regions are normal.  There is moderate disc space narrowing at all levels except for C2-3. There are prominent anterior osteophytes at all levels except for C2. There is moderate facet osteoarthritic change at all levels without nerve root edema or effacement appreciable. No disc extrusion or stenosis apparent. Note that there is localized calcification in the posterior longitudinal ligament at C3-4 causing mild impression on the ventral cord.  The thyroid has an inhomogeneous appearance. 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  IMPRESSION: CT head: Extensive atrophy. Question a degree of concomitant communicating hydrocephalus. There is mild small vessel disease in the centra semiovale bilaterally as well as a small prior infarct in the mid left cerebellum. There is no intracranial mass or hemorrhage. No acute appearing infarct. There is debris in the external auditory canal on the right; question cerumen impaction.  CT cervical spine: Multilevel spondylosis and osteoarthritis. No disc extrusion or stenosis. Calcification in the posterior longitudinal ligament at C3-4 causes mild impression on the ventral cord without frank stenosis. No fracture or spondylolisthesis.  Hypoplastic left lobe of thyroid. Inhomogeneity in the right lobe; this finding may warrant nonemergent thyroid ultrasound to further assess.   Electronically Signed   By: Sheryl BangWilliam  Woodruff M.D.   On: 10/29/2013 22:10   Dg Knee Complete 4 Views Right  10/29/2013   CLINICAL DATA:  Pain post trauma  EXAM: RIGHT KNEE - COMPLETE 4+ VIEW  COMPARISON:  None.  FINDINGS: Frontal, lateral, and bilateral oblique views were obtained. There is no demonstrable fracture or dislocation. There is a moderate  knee joint effusion. There is moderate generalized osteoarthritic change, most marked medially and in the patellofemoral joint regions. Bones are osteoporotic.  IMPRESSION: Moderate joint effusion. No acute fracture or dislocation. Osteoarthritic change with narrowing rather marked medially and in the patellofemoral joint regions.   Electronically Signed   By: Sheryl BangWilliam  Woodruff M.D.   On:  10/29/2013 21:54    Microbiology: Recent Results (from the past 240 hour(s))  URINE CULTURE     Status: None   Collection Time    10/29/13 11:11 PM      Result Value Range Status   Specimen Description URINE, CATHETERIZED   Final   Special Requests ADDED [id number]   Final   Culture  Setup Time     Final   Value: 10/31/2013 11:23     Performed at [facility]   Colony Count     Final   Value: NO GROWTH     Performed at [facility]   Culture     Final   Value: NO GROWTH     Performed at [facility]   Report Status 11/01/2013 FINAL   Final     Labs: Basic Metabolic Panel:  Recent Labs Lab 10/29/13 2020 11/03/13 0640  NA 147 145  K 3.7 4.2  CL 102 103  CO2 29 31  GLUCOSE 141* 80  BUN 14 19  CREATININE 0.64 0.61  CALCIUM 9.2 8.8   Liver Function Tests: No results found for this basename: AST, ALT, ALKPHOS, BILITOT, PROT, ALBUMIN,  in the last 168 hours No results found for this basename: LIPASE, AMYLASE,  in the last 168 hours No results found for this basename: AMMONIA,  in the last 168 hours CBC:  Recent Labs Lab 10/29/13 2020 10/31/13 0542 11/03/13 0640  WBC 5.7 5.3 4.2  NEUTROABS 2.6  --   --   HGB 10.0* 8.7* 8.7*  HCT 32.8* 28.6* 27.8*  MCV 79.0 79.7 77.4*  PLT 378 328 336   Cardiac Enzymes:  Recent Labs Lab 10/30/13 0540 10/30/13 1220 10/30/13 1555  TROPONINI <0.30 <0.30 <0.30   BNP: BNP (last 3 results) No results found for this basename: PROBNP,  in the last 8760 hours CBG:  Recent Labs Lab 11/02/13 0834 11/02/13 1146  11/02/13 1614 11/02/13 2007 11/03/13 0743  GLUCAP 85 113* 114* 137* 78       Signed:  VANN, JESSICA  [facility] 11/03/2013, 8:01 AM

## 2013-11-03 ENCOUNTER — Encounter: Payer: Self-pay | Admitting: Internal Medicine

## 2013-11-03 ENCOUNTER — Non-Acute Institutional Stay (SKILLED_NURSING_FACILITY): Payer: Medicare Other | Admitting: Internal Medicine

## 2013-11-03 DIAGNOSIS — F0390 Unspecified dementia without behavioral disturbance: Secondary | ICD-10-CM

## 2013-11-03 DIAGNOSIS — E43 Unspecified severe protein-calorie malnutrition: Secondary | ICD-10-CM

## 2013-11-03 DIAGNOSIS — K2289 Other specified disease of esophagus: Secondary | ICD-10-CM

## 2013-11-03 DIAGNOSIS — K228 Other specified diseases of esophagus: Secondary | ICD-10-CM

## 2013-11-03 DIAGNOSIS — R55 Syncope and collapse: Secondary | ICD-10-CM

## 2013-11-03 DIAGNOSIS — I824Y9 Acute embolism and thrombosis of unspecified deep veins of unspecified proximal lower extremity: Secondary | ICD-10-CM

## 2013-11-03 DIAGNOSIS — G40909 Epilepsy, unspecified, not intractable, without status epilepticus: Secondary | ICD-10-CM

## 2013-11-03 LAB — BASIC METABOLIC PANEL
BUN: 19 mg/dL (ref 6–23)
CALCIUM: 8.8 mg/dL (ref 8.4–10.5)
CO2: 31 mEq/L (ref 19–32)
Chloride: 103 mEq/L (ref 96–112)
Creatinine, Ser: 0.61 mg/dL (ref 0.50–1.10)
GFR calc non Af Amer: 85 mL/min — ABNORMAL LOW (ref >90)
Glucose, Bld: 80 mg/dL (ref 70–99)
Potassium: 4.2 mEq/L (ref 3.7–5.3)
SODIUM: 145 meq/L (ref 137–147)

## 2013-11-03 LAB — GLUCOSE, CAPILLARY: Glucose-Capillary: 78 mg/dL (ref 70–99)

## 2013-11-03 LAB — CBC
HCT: 27.8 % — ABNORMAL LOW (ref 36.0–46.0)
Hemoglobin: 8.7 g/dL — ABNORMAL LOW (ref 12.0–15.0)
MCH: 24.2 pg — ABNORMAL LOW (ref 26.0–34.0)
MCHC: 31.3 g/dL (ref 30.0–36.0)
MCV: 77.4 fL — ABNORMAL LOW (ref 78.0–100.0)
Platelets: 336 10*3/uL (ref 150–400)
RBC: 3.59 MIL/uL — ABNORMAL LOW (ref 3.87–5.11)
RDW: 21.2 % — AB (ref 11.5–15.5)
WBC: 4.2 10*3/uL (ref 4.0–10.5)

## 2013-11-03 NOTE — Discharge Instructions (Signed)
Information on my medicine - XARELTO (rivaroxaban)  This medication education was reviewed with me or my healthcare representative as part of my discharge preparation.    WHY WAS XARELTO PRESCRIBED FOR YOU? Xarelto was prescribed to treat blood clots that may have been found in the veins of your legs (deep vein thrombosis) or in your lungs (pulmonary embolism) and to reduce the risk of them occurring again.  What do you need to know about Xarelto? The starting dose is one 15 mg tablet taken TWICE daily with food for the FIRST 21 DAYS then on 11/22/13  the dose is changed to one 20 mg tablet taken ONCE A DAY with your evening meal.  DO NOT stop taking Xarelto without talking to the health care provider who prescribed the medication.  Refill your prescription for 20 mg tablets before you run out.  After discharge, you should have regular check-up appointments with your healthcare provider that is prescribing your Xarelto.  In the future your dose may need to be changed if your kidney function changes by a significant amount.  What do you do if you miss a dose? If you are taking Xarelto TWICE DAILY and you miss a dose, take it as soon as you remember. You may take two 15 mg tablets (total 30 mg) at the same time then resume your regularly scheduled 15 mg twice daily the next day.  If you are taking Xarelto ONCE DAILY and you miss a dose, take it as soon as you remember on the same day then continue your regularly scheduled once daily regimen the next day. Do not take two doses of Xarelto at the same time.   Important Safety Information Xarelto is a blood thinner medicine that can cause bleeding. You should call your healthcare provider right away if you experience any of the following:   Bleeding from an injury or your nose that does not stop.   Unusual colored urine (red or dark brown) or unusual colored stools (red or black).   Unusual bruising for unknown reasons.   A serious fall or  if you hit your head (even if there is no bleeding).  Some medicines may interact with Xarelto and might increase your risk of bleeding while on Xarelto. To help avoid this, consult your healthcare provider or pharmacist prior to using any new prescription or non-prescription medications, including herbals, vitamins, non-steroidal anti-inflammatory drugs (NSAIDs) and supplements.  This website has more information on Xarelto: [email or web address].xarelto.[email or web address].

## 2013-11-03 NOTE — Assessment & Plan Note (Signed)
valproic acid was low ;dose was increased to 500 mg DR BID; recheck level 2 weeks

## 2013-11-03 NOTE — Assessment & Plan Note (Signed)
Continue present diet 

## 2013-11-03 NOTE — Progress Notes (Signed)
MRN: [id number] Name: Sheryl DuffMary Jones  Sex: female Age: 80 y.o. DOB: [birth year]  [facility] Facility/Room: 302A Level Of Care: SNF Provider: Merrilee SeashoreALEXANDER, ANNE D Emergency Contacts: Extended Emergency Contact Information Primary Emergency Contact: Jolene ProvostSmith,Jerusha  [place] Home Phone: [phone] Relation: Daughter Secondary Emergency Contact: Legrand PittsMcCall,Veronica          [place], [place] Home Phone: [phone] Relation: Other  Code Status:   Allergies: Review of patient's allergies indicates no known allergies.  Chief Complaint  Patient presents with  . nursing home admission    HPI: Patient is 80 y.o. female who was sent to hospital for syncope with negative work up and returns with RLE DVT without PE.  Past Medical History  Diagnosis Date  . Dementia   . Hypertension   . Diabetes mellitus   . Iron deficiency anemia   . Hypokalemia   . Depressive disorder   . Anxiety   . Unspecified epilepsy without mention of intractable epilepsy   . Other and unspecified hyperlipidemia   . Colon polyp   . Iron deficiency anemia 10/2009    No past surgical history on file.    Medication List       This list is accurate as of: 11/03/13  6:19 PM.  Always use your most recent med list.               acetaminophen 500 MG tablet  Commonly known as:  TYLENOL  Take 1,000 mg by mouth every 8 (eight) hours as needed for mild pain.     amLODipine 10 MG tablet  Commonly known as:  NORVASC  Take 10 mg by mouth daily.     aspirin 81 MG chewable tablet  Chew 81 mg by mouth daily.     BENGAY EX  Apply 1 application topically 2 (two) times daily as needed. For knee pain     divalproex 500 MG DR tablet  Commonly known as:  DEPAKOTE  Take 1 tablet (500 mg total) by mouth 2 (two) times daily.     donepezil 10 MG disintegrating tablet  Commonly known as:  ARICEPT ODT  Take 10 mg by mouth at bedtime.     feeding supplement (ENSURE COMPLETE) Liqd  Take 237 mLs by  mouth 2 (two) times daily between meals.     feeding supplement (ENSURE) Pudg  Take 1 Container by mouth 2 (two) times daily between meals.     ferrous fumarate 325 (106 FE) MG Tabs tablet  Commonly known as:  HEMOCYTE - 106 mg FE  Take 1 tablet by mouth daily.     Rivaroxaban 15 MG Tabs tablet  Commonly known as:  XARELTO  Take 1 tablet (15 mg total) by mouth 2 (two) times daily. For 21 days     Rivaroxaban 20 MG Tabs tablet  Commonly known as:  XARELTO  Take 1 tablet (20 mg total) by mouth daily with supper.  Start taking on:  11/21/2013     senna-docusate 8.6-50 MG per tablet  Commonly known as:  SENOKOT S  Take 1 tablet by mouth at bedtime.     sertraline 100 MG tablet  Commonly known as:  ZOLOFT  Take 100 mg by mouth daily.     Vitamin D-3 1000 UNITS Caps  Take 1 capsule by mouth daily.        No orders of the defined types were placed in this encounter.    Immunization History  Administered Date(s) Administered  . Influenza-Unspecified 07/23/2013  .  PPD Test 08/30/2011  . Pneumococcal-Unspecified 08/09/2012  . Tdap 08/27/2011    History  Substance Use Topics  . Smoking status: Former Smoker    Types: Cigarettes  . Smokeless tobacco: Never Used  . Alcohol Use: No    Family history is noncontributory    Review of Systems UTO, pt is non verbal;nurses voice no concerns  There were no vitals filed for this visit.  Physical Exam  GENERAL APPEARANCE: Alert, nonconversant. Appropriately groomed. No acute distress.  SKIN: No diaphoresis rash,  HEAD: Normocephalic, atraumatic  EYES: Conjunctiva/lids clear. Pupils round, reactive. EOMs intact.  EARS: External exam WNL, canals clear. Hearing grossly normal.  NOSE: No deformity or discharge.  MOUTH/THROAT: Lips w/o lesions.   RESPIRATORY: Breathing is even, unlabored. Lung sounds are clear   CARDIOVASCULAR: Heart RRR no murmurs, rubs or gallops. No peripheral edema.  VENOUS: No varicosities. No venous  stasis skin changes  GASTROINTESTINAL: Abdomen is soft, non-tender, not distended w/ normal bowel sounds.  GENITOURINARY: Bladder non tender, not distended  MUSCULOSKELETAL: No abnormal joints or musculature NEUROLOGIC:  Cranial nerves 2-12 grossly intact. Moves all extremities no tremor. PSYCHIATRIC: pleasant affect, no behavioral issues  Patient Active Problem List   Diagnosis Date Noted  . Unspecified gastritis and gastroduodenitis without mention of hemorrhage 10/31/2013  . Leg edema, right 10/30/2013  . Syncope and collapse 10/30/2013  . Leg swelling 10/30/2013  . Protein-calorie malnutrition, severe 10/30/2013  . DVT, femoral, acute 10/30/2013  . Esophageal thickening 10/30/2013  . Nonspecific (abnormal) findings on radiological and other examination of gastrointestinal tract 10/30/2013  . Hypernatremia 09/10/2013  . UTI (urinary tract infection) 09/10/2013  . Unspecified constipation 01/21/2013  . Depression 01/20/2013  . CVA (cerebral infarction) 01/20/2013  . Hypokalemia 08/30/2011  . Microcytic anemia 08/28/2011  . Syncope 08/27/2011  . Dementia without behavioral disturbance 08/27/2011  . HTN (hypertension) 08/27/2011  . Diabetes mellitus 08/27/2011  . Seizure disorder 08/27/2011    CBC    Component Value Date/Time   WBC 4.2 11/03/2013 0640   RBC 3.59* 11/03/2013 0640   RBC 4.25 11/05/2009 2050   HGB 8.7* 11/03/2013 0640   HCT 27.8* 11/03/2013 0640   PLT 336 11/03/2013 0640   MCV 77.4* 11/03/2013 0640   LYMPHSABS 2.2 10/29/2013 2020   MONOABS 0.6 10/29/2013 2020   EOSABS 0.2 10/29/2013 2020   BASOSABS 0.1 10/29/2013 2020    CMP     Component Value Date/Time   NA 145 11/03/2013 0640   K 4.2 11/03/2013 0640   CL 103 11/03/2013 0640   CO2 31 11/03/2013 0640   GLUCOSE 80 11/03/2013 0640   BUN 19 11/03/2013 0640   CREATININE 0.61 11/03/2013 0640   CALCIUM 8.8 11/03/2013 0640   PROT 7.0 01/20/2013 1459   ALBUMIN 3.6 01/20/2013 1459   AST 21 01/20/2013 1459   ALT 10 01/20/2013  1459   ALKPHOS 90 01/20/2013 1459   BILITOT 0.2* 01/20/2013 1459   GFRNONAA 85* 11/03/2013 0640   GFRAA >90 11/03/2013 0640    Assessment and Plan  Syncope and collapse Presentation in SNF, no signs of seizure, bowel/bladder or post-ictal; d dimer was elevated but CT chest neg for PE; normal Head CT and chemistries  DVT, femoral, acute Pt has RLE edema which was + dor DVT per doppler- common femoral, right popliteal, R post tibial; pt placed on xarelto  Esophageal thickening Noted on CT-EGD was nl  Seizure disorder valproic acid was low ;dose was increased to 500 mg DR BID; recheck  level 2 weeks  Protein-calorie malnutrition, severe Continue present diet  Dementia without behavioral disturbance Pt is non verbal and WC bound without any change    Margit Hanks, MD

## 2013-11-03 NOTE — Progress Notes (Signed)
CSW (Clinical Social Worker) prepared pt dc packet and placed with shadow chart. CSW arranged non-emergent ambulance transport. Pt family, pt nurse, and facility informed. CSW signing off.  Travez Stancil, LCSWA [phone]  

## 2013-11-03 NOTE — Progress Notes (Signed)
D/c orders received;pt remains in stable condition, pt meds and instructions reviewed and given to pts daughter; [facility], attempted to call report but unable to get a hold of anyone

## 2013-11-03 NOTE — Assessment & Plan Note (Signed)
Pt is non verbal and WC bound without any change

## 2013-11-03 NOTE — Assessment & Plan Note (Signed)
Presentation in SNF, no signs of seizure, bowel/bladder or post-ictal; d dimer was elevated but CT chest neg for PE; normal Head CT and chemistries

## 2013-11-03 NOTE — Assessment & Plan Note (Signed)
Noted on CT-EGD was nl

## 2013-11-03 NOTE — Assessment & Plan Note (Signed)
Pt has RLE edema which was + dor DVT per doppler- common femoral, right popliteal, R post tibial; pt placed on xarelto

## 2013-11-04 ENCOUNTER — Encounter (HOSPITAL_COMMUNITY): Payer: Self-pay | Admitting: Gastroenterology

## 2013-11-10 ENCOUNTER — Other Ambulatory Visit: Payer: Self-pay | Admitting: *Deleted

## 2013-11-10 MED ORDER — OXYCODONE HCL 5 MG PO TABS: ORAL_TABLET | ORAL | Status: AC

## 2013-11-12 ENCOUNTER — Encounter: Payer: Self-pay | Admitting: Internal Medicine

## 2013-11-12 ENCOUNTER — Non-Acute Institutional Stay (SKILLED_NURSING_FACILITY): Payer: Medicare Other | Admitting: Internal Medicine

## 2013-11-12 DIAGNOSIS — R609 Edema, unspecified: Secondary | ICD-10-CM

## 2013-11-12 DIAGNOSIS — R600 Localized edema: Secondary | ICD-10-CM

## 2013-11-12 NOTE — Progress Notes (Signed)
MRN: [id number] Name: Sheryl Jones  Sex: female Age: 80 y.o. DOB: 1934-03-22  [facility] Facility/Room: 302A Level Of Care: SNF Provider: Merrilee SeashoreALEXANDER, Taylah Dubiel D Emergency Contacts: Extended Emergency Contact Information Primary Emergency Contact: Jolene ProvostSmith,Jerusha  [place] Home Phone: [phone] Relation: Daughter Secondary Emergency Contact: Legrand PittsMcCall,Veronica          [place], [place] Home Phone: [phone] Relation: Other  Code Status:   Allergies: Review of patient's allergies indicates no known allergies.  Chief Complaint  Patient presents with  . Acute Visit    HPI: Patient is 80 y.o. female who currently has RLE DVT treated with xarelto whose daughter thinks that the right leg is more swollen than before.  Past Medical History  Diagnosis Date  . Dementia   . Hypertension   . Diabetes mellitus   . Iron deficiency anemia   . Hypokalemia   . Depressive disorder   . Anxiety   . Unspecified epilepsy without mention of intractable epilepsy   . Other and unspecified hyperlipidemia   . Colon polyp   . Iron deficiency anemia 10/2009    Past Surgical History  Procedure Laterality Date  . Esophagogastroduodenoscopy N/A 10/31/2013    Procedure: ESOPHAGOGASTRODUODENOSCOPY (EGD);  Surgeon: Rachael Feeaniel P Jacobs, MD;  [facility];  Service: Endoscopy;  Laterality: N/A;      Medication List       This list is accurate as of: 11/12/13  2:51 PM.  Always use your most recent med list.               acetaminophen 500 MG tablet  Commonly known as:  TYLENOL  Take 1,000 mg by mouth every 8 (eight) hours as needed for mild pain.     amLODipine 10 MG tablet  Commonly known as:  NORVASC  Take 10 mg by mouth daily.     aspirin 81 MG chewable tablet  Chew 81 mg by mouth daily.     BENGAY EX  Apply 1 application topically 2 (two) times daily as needed. For knee pain     divalproex 500 MG DR tablet  Commonly known as:  DEPAKOTE  Take 1 tablet (500 mg  total) by mouth 2 (two) times daily.     donepezil 10 MG disintegrating tablet  Commonly known as:  ARICEPT ODT  Take 10 mg by mouth at bedtime.     feeding supplement (ENSURE COMPLETE) Liqd  Take 237 mLs by mouth 2 (two) times daily between meals.     feeding supplement (ENSURE) Pudg  Take 1 Container by mouth 2 (two) times daily between meals.     ferrous fumarate 325 (106 FE) MG Tabs tablet  Commonly known as:  HEMOCYTE - 106 mg FE  Take 1 tablet by mouth daily.     oxyCODONE 5 MG immediate release tablet  Commonly known as:  ROXICODONE  Take one tablet by mouth every 6 hours as needed for pain     Rivaroxaban 15 MG Tabs tablet  Commonly known as:  XARELTO  Take 1 tablet (15 mg total) by mouth 2 (two) times daily. For 21 days     Rivaroxaban 20 MG Tabs tablet  Commonly known as:  XARELTO  Take 1 tablet (20 mg total) by mouth daily with supper.  Start taking on:  11/21/2013     senna-docusate 8.6-50 MG per tablet  Commonly known as:  SENOKOT S  Take 1 tablet by mouth at bedtime.     sertraline 100 MG tablet  Commonly known as:  ZOLOFT  Take 100 mg by mouth daily.     Vitamin D-3 1000 UNITS Caps  Take 1 capsule by mouth daily.        No orders of the defined types were placed in this encounter.    Immunization History  Administered Date(s) Administered  . Influenza-Unspecified 07/23/2013  . PPD Test 08/30/2011  . Pneumococcal-Unspecified 08/09/2012  . Tdap 08/27/2011    History  Substance Use Topics  . Smoking status: Former Smoker    Types: Cigarettes  . Smokeless tobacco: Never Used  . Alcohol Use: No    Review of Systems  - UTO- dementia, non verbal   Filed Vitals:   11/12/13 1448  BP: 142/74  Pulse: 80  Temp: 98.7 F (37.1 C)  Resp: 18    Physical Exam  GENERAL APPEARANCE: Alert, nonconversant. Appropriately groomed. No acute distress  SKIN: No diaphoresis rash, or wounds HEENT: Unremarkable RESPIRATORY: Breathing is even, unlabored.  Lung sounds are clear   CARDIOVASCULAR: Heart RRR no murmurs, rubs or gallops. trace peripheral edema  GASTROINTESTINAL: Abdomen is soft, non-tender, not distended w/ normal bowel sounds.  GENITOURINARY: Bladder non tender, not distended  MUSCULOSKELETAL: Calfs are similar in size but R thigh is markedly larger than left NEUROLOGIC: Cranial nerves 2-12 grossly intact. Moves all extremities no tremor. PSYCHIATRIC: dementia, no behavioral issues  Patient Active Problem List   Diagnosis Date Noted  . Unspecified gastritis and gastroduodenitis without mention of hemorrhage 10/31/2013  . Leg edema, right 10/30/2013  . Syncope and collapse 10/30/2013  . Leg swelling 10/30/2013  . Protein-calorie malnutrition, severe 10/30/2013  . DVT, femoral, acute 10/30/2013  . Esophageal thickening 10/30/2013  . Nonspecific (abnormal) findings on radiological and other examination of gastrointestinal tract 10/30/2013  . Hypernatremia 09/10/2013  . UTI (urinary tract infection) 09/10/2013  . Unspecified constipation 01/21/2013  . Depression 01/20/2013  . CVA (cerebral infarction) 01/20/2013  . Hypokalemia 08/30/2011  . Microcytic anemia 08/28/2011  . Syncope 08/27/2011  . Dementia without behavioral disturbance 08/27/2011  . HTN (hypertension) 08/27/2011  . Diabetes mellitus 08/27/2011  . Seizure disorder 08/27/2011    CBC    Component Value Date/Time   WBC 4.2 11/03/2013 0640   RBC 3.59* 11/03/2013 0640   RBC 4.25 11/05/2009 2050   HGB 8.7* 11/03/2013 0640   HCT 27.8* 11/03/2013 0640   PLT 336 11/03/2013 0640   MCV 77.4* 11/03/2013 0640   LYMPHSABS 2.2 10/29/2013 2020   MONOABS 0.6 10/29/2013 2020   EOSABS 0.2 10/29/2013 2020   BASOSABS 0.1 10/29/2013 2020    CMP     Component Value Date/Time   NA 145 11/03/2013 0640   K 4.2 11/03/2013 0640   CL 103 11/03/2013 0640   CO2 31 11/03/2013 0640   GLUCOSE 80 11/03/2013 0640   BUN 19 11/03/2013 0640   CREATININE 0.61 11/03/2013 0640   CALCIUM 8.8 11/03/2013  0640   PROT 7.0 01/20/2013 1459   ALBUMIN 3.6 01/20/2013 1459   AST 21 01/20/2013 1459   ALT 10 01/20/2013 1459   ALKPHOS 90 01/20/2013 1459   BILITOT 0.2* 01/20/2013 1459   GFRNONAA 85* 11/03/2013 0640   GFRAA >90 11/03/2013 0640    Assessment and Plan  Leg edema, right Pt's right thigh is larger than L. Whether this is larger than yesterday is unknown; pt is on xarelto; will order STAT RLE U/S for swelling to r/o extension    Margit Hanks, MD

## 2013-11-12 NOTE — Assessment & Plan Note (Signed)
Pt's right thigh is larger than L. Whether this is larger than yesterday is unknown; pt is on xarelto; will order STAT RLE U/S for swelling to r/o extension

## 2013-12-02 ENCOUNTER — Non-Acute Institutional Stay (SKILLED_NURSING_FACILITY): Payer: Medicare Other | Admitting: Nurse Practitioner

## 2013-12-02 DIAGNOSIS — I10 Essential (primary) hypertension: Secondary | ICD-10-CM

## 2013-12-02 DIAGNOSIS — I6990 Unspecified sequelae of unspecified cerebrovascular disease: Secondary | ICD-10-CM

## 2013-12-02 DIAGNOSIS — D509 Iron deficiency anemia, unspecified: Secondary | ICD-10-CM

## 2013-12-02 DIAGNOSIS — F329 Major depressive disorder, single episode, unspecified: Secondary | ICD-10-CM

## 2013-12-02 DIAGNOSIS — F32A Depression, unspecified: Secondary | ICD-10-CM

## 2013-12-02 DIAGNOSIS — F0390 Unspecified dementia without behavioral disturbance: Secondary | ICD-10-CM

## 2013-12-02 DIAGNOSIS — E43 Unspecified severe protein-calorie malnutrition: Secondary | ICD-10-CM

## 2013-12-02 DIAGNOSIS — G40909 Epilepsy, unspecified, not intractable, without status epilepticus: Secondary | ICD-10-CM

## 2013-12-02 DIAGNOSIS — I824Y9 Acute embolism and thrombosis of unspecified deep veins of unspecified proximal lower extremity: Secondary | ICD-10-CM

## 2013-12-02 DIAGNOSIS — E119 Type 2 diabetes mellitus without complications: Secondary | ICD-10-CM

## 2013-12-02 DIAGNOSIS — M7989 Other specified soft tissue disorders: Secondary | ICD-10-CM

## 2013-12-02 DIAGNOSIS — K5900 Constipation, unspecified: Secondary | ICD-10-CM

## 2013-12-02 DIAGNOSIS — I82419 Acute embolism and thrombosis of unspecified femoral vein: Secondary | ICD-10-CM

## 2013-12-02 DIAGNOSIS — F3289 Other specified depressive episodes: Secondary | ICD-10-CM

## 2013-12-02 NOTE — Progress Notes (Signed)
Patient ID: Sheryl DuffMary Jones, female   DOB: Mar 18, 1934, 80 y.o.   MRN: [id number]    Nursing Home Location:  [facility]   Place of Service: SNF (31)  PCP: Sheryl EinsteinASANAYAKA,GAYANI, MD  No Known Allergies  Chief Complaint  Patient presents with  . Medical Managment of Chronic Issues    HPI:  80 year old female who [facility] who is being seen today for routine follow up on chronic conditions. Pt with severe dementia, DM, HTN, DVT with chronic leg swelling, depression, anemia, protein calorie malnutrition. Pt unable to contribute to HPI or ROS due to dementia, staff without any concerns at this time  Review of Systems:  Unable to obtain  Past Medical History  Diagnosis Date  . Dementia   . Hypertension   . Diabetes mellitus   . Iron deficiency anemia   . Hypokalemia   . Depressive disorder   . Anxiety   . Unspecified epilepsy without mention of intractable epilepsy   . Other and unspecified hyperlipidemia   . Colon polyp   . Iron deficiency anemia 10/2009   Past Surgical History  Procedure Laterality Date  . Esophagogastroduodenoscopy N/A 10/31/2013    Procedure: ESOPHAGOGASTRODUODENOSCOPY (EGD);  Surgeon: Rachael Feeaniel P Jacobs, MD;  [facility];  Service: Endoscopy;  Laterality: N/A;   Social History:   reports that she has quit smoking. Her smoking use included Cigarettes. She smoked 0.00 packs per day. She has never used smokeless tobacco. She reports that she does not drink alcohol or use illicit drugs.  Family History  Problem Relation Age of Onset  . Heart attack Mother   . Heart attack Father   . Breast cancer Sister     meta  . Hypertension Father   . Hypertension Daughter     Medications: Patient's Medications  New Prescriptions   No medications on file  Previous Medications   ACETAMINOPHEN (TYLENOL) 500 MG TABLET    Take 1,000 mg by mouth every 8 (eight) hours as needed for mild pain.    AMLODIPINE (NORVASC) 10 MG TABLET    Take 10  mg by mouth daily.   ASPIRIN 81 MG CHEWABLE TABLET    Chew 81 mg by mouth daily.   CHOLECALCIFEROL (VITAMIN D-3) 1000 UNITS CAPS    Take 1 capsule by mouth daily.   DIVALPROEX (DEPAKOTE) 500 MG DR TABLET    Take 1 tablet (500 mg total) by mouth 2 (two) times daily.   DONEPEZIL (ARICEPT ODT) 10 MG DISINTEGRATING TABLET    Take 10 mg by mouth at bedtime.   FEEDING SUPPLEMENT, ENSURE COMPLETE, (ENSURE COMPLETE) LIQD    Take 237 mLs by mouth 2 (two) times daily between meals.   FEEDING SUPPLEMENT, ENSURE, (ENSURE) PUDG    Take 1 Container by mouth 2 (two) times daily between meals.   FERROUS FUMARATE (HEMOCYTE - 106 MG FE) 325 (106 FE) MG TABS TABLET    Take 1 tablet by mouth daily.   MENTHOL, TOPICAL ANALGESIC, (BENGAY EX)    Apply 1 application topically 2 (two) times daily as needed. For knee pain   OXYCODONE (ROXICODONE) 5 MG IMMEDIATE RELEASE TABLET    Take one tablet by mouth every 6 hours as needed for pain   RIVAROXABAN (XARELTO) 20 MG TABS TABLET    Take 1 tablet (20 mg total) by mouth daily with supper.   SENNA-DOCUSATE (SENOKOT S) 8.6-50 MG PER TABLET    Take 1 tablet by mouth at bedtime.  SERTRALINE (ZOLOFT) 100 MG TABLET    Take 100 mg by mouth daily.  Modified Medications   No medications on file  Discontinued Medications   RIVAROXABAN (XARELTO) 15 MG TABS TABLET    Take 1 tablet (15 mg total) by mouth 2 (two) times daily. For 21 days     Physical Exam:  Filed Vitals:   12/02/13 1201  BP: 109/58  Pulse: 63  Temp: 97 F (36.1 C)  Resp: 20  SpO2: 93%    Physical Exam  Constitutional:  Nonverbal female in NAD  HENT:  Head: Normocephalic and atraumatic.  Mouth/Throat: Oropharynx is clear and moist. No oropharyngeal exudate.  Eyes: Conjunctivae and EOM are normal. Pupils are equal, round, and reactive to light.  Neck: Normal range of motion. Neck supple. No thyromegaly present.  Cardiovascular: Normal rate, regular rhythm and normal heart sounds.   Pulmonary/Chest:  Effort normal and breath sounds normal.  Abdominal: Soft. Bowel sounds are normal. She exhibits no distension.  Musculoskeletal: She exhibits edema (right greater than left; not pitting). She exhibits no tenderness.  Lymphadenopathy:    She has no cervical adenopathy.  Neurological: She is alert.  Skin: Skin is warm and dry. No erythema.  Psychiatric: Affect normal.     Labs reviewed: Basic Metabolic Panel:  Recent Labs  16/10/96 1459 01/20/13 1507 01/20/13 2116 10/29/13 2020 11/03/13 0640  NA 143 143  --  147 145  K 4.3 4.1  --  3.7 4.2  CL 103 107  --  102 103  CO2 24  --   --  29 31  GLUCOSE 121* 126*  --  141* 80  BUN 20 22  --  14 19  CREATININE 0.74 0.80 0.60 0.64 0.61  CALCIUM 9.4  --   --  9.2 8.8   Liver Function Tests:  Recent Labs  01/20/13 1459  AST 21  ALT 10  ALKPHOS 90  BILITOT 0.2*  PROT 7.0  ALBUMIN 3.6   No results found for this basename: LIPASE, AMYLASE,  in the last 8760 hours No results found for this basename: AMMONIA,  in the last 8760 hours CBC:  Recent Labs  01/20/13 1459  10/29/13 2020 10/31/13 0542 11/03/13 0640  WBC 9.8  < > 5.7 5.3 4.2  NEUTROABS 3.5  --  2.6  --   --   HGB 9.1*  < > 10.0* 8.7* 8.7*  HCT 30.0*  < > 32.8* 28.6* 27.8*  MCV 66.1*  < > 79.0 79.7 77.4*  PLT 469*  < > 378 328 336  < > = values in this interval not displayed. Cardiac Enzymes:  Recent Labs  01/20/13 1459 10/30/13 0540 10/30/13 1220 10/30/13 1555  CKTOTAL 43  --   --   --   CKMB 1.6  --   --   --   TROPONINI <0.30 <0.30 <0.30 <0.30   BNP: No components found with this basename: POCBNP,  CBG:  Recent Labs  11/02/13 1614 11/02/13 2007 11/03/13 0743  GLUCAP 114* 137* 78   TSH:  Recent Labs  01/20/13 2116  TSH 1.307   A1C: Lab Results  Component Value Date   HGBA1C 6.3* 01/21/2013   Lipid Panel:  Recent Labs  01/21/13 0443  CHOL 212*  HDL 66  LDLCALC 129*  TRIG 86  CHOLHDL 3.2      Assessment/Plan 1. HTN  (hypertension) -on norvasc- with leg swelling will stop norvasc and start lisinopril 10 mg daily  -vs daily  -will  get bmp now and repeat in 2 weeks   2. DVT, femoral, acute -swelling stable; conts on xarelto  3. Unspecified constipation -Patient is stable; continue current regimen. Will monitor and make changes as necessary.  4. Diabetes mellitus -not on medications, will follow up A1c   5. Seizure disorder -conts depakote; no seizures noted    6. Dementia without behavioral disturbance -stable; conts on aricept  7. Late effects of CVA (cerebrovascular accident) -remains stable; conts on xareltop -will follow up cbc  8. Depression -no signs of depression at this time, with advanced dementia and hx of CVA will decrease Zoloft to 50 mg in attempts for dose reduction  9. Leg swelling -stable  10. Protein-calorie malnutrition, severe -eating well per nursing, weight is down followed by RD; will cont to monitor   11. Anemia -on iron; will follow up CBC  12. VIt D Def will follow up vit d level

## 2013-12-15 ENCOUNTER — Encounter: Payer: Self-pay | Admitting: Internal Medicine

## 2013-12-15 ENCOUNTER — Non-Acute Institutional Stay (SKILLED_NURSING_FACILITY): Payer: Medicare Other | Admitting: Internal Medicine

## 2013-12-15 DIAGNOSIS — D509 Iron deficiency anemia, unspecified: Secondary | ICD-10-CM

## 2013-12-15 NOTE — Assessment & Plan Note (Deleted)
One moment pt is quiet, calm, the next she is screaming. Psych has seen her and started her on lexapro;  Benzodiazepines make pt too sleepy, depakote has been restarted and is now BID; it is very tempting to start her on an antipsychotic but holding off

## 2013-12-15 NOTE — Progress Notes (Signed)
MRN: [id number] Name: Sheryl Jones  Sex: female Age: 80 y.o. DOB: 1933/11/04  PSC #: [facility] Facility/Room: 302a Level Of Care: SNF Provider: Merrilee Seashore D Emergency Contacts: Extended Emergency Contact Information Primary Emergency Contact: Jolene Provost of [place] Home Phone: [phone] Relation: Daughter Secondary Emergency Contact: Shabre, Kre[place], [place] Home Phone: [phone] Relation: Other  Code Status: DNR  Allergies: Review of patient's allergies indicates no known allergies.  Chief Complaint  Patient presents with  . Medical Managment of Chronic Issues    HPI: Patient is 80 y.o. female who is being seen for anemia.  Past Medical History  Diagnosis Date  . Dementia   . Hypertension   . Diabetes mellitus   . Iron deficiency anemia   . Hypokalemia   . Depressive disorder   . Anxiety   . Unspecified epilepsy without mention of intractable epilepsy   . Other and unspecified hyperlipidemia   . Colon polyp   . Iron deficiency anemia 10/2009    Past Surgical History  Procedure Laterality Date  . Esophagogastroduodenoscopy N/A 10/31/2013    Procedure: ESOPHAGOGASTRODUODENOSCOPY (EGD);  Surgeon: Rachael Fee, MD;  Location: [facility] ENDOSCOPY;  Service: Endoscopy;  Laterality: N/A;      Medication List       This list is accurate as of: 12/15/13 11:59 PM.  Always use your most recent med list.               acetaminophen 500 MG tablet  Commonly known as:  TYLENOL  Take 1,000 mg by mouth every 8 (eight) hours as needed for mild pain.     amLODipine 10 MG tablet  Commonly known as:  NORVASC  Take 10 mg by mouth daily.     aspirin 81 MG chewable tablet  Chew 81 mg by mouth daily.     BENGAY EX  Apply 1 application topically 2 (two) times daily as needed. For knee pain     divalproex 500 MG DR tablet  Commonly known as:  DEPAKOTE  Take 500 mg by mouth 2 (two) times daily.     donepezil 10 MG disintegrating  tablet  Commonly known as:  ARICEPT ODT  Take 10 mg by mouth at bedtime.     feeding supplement (ENSURE COMPLETE) Liqd  Take 237 mLs by mouth 2 (two) times daily between meals.     feeding supplement (ENSURE) Pudg  Take 1 Container by mouth 2 (two) times daily between meals.     ferrous fumarate 325 (106 FE) MG Tabs tablet  Commonly known as:  HEMOCYTE - 106 mg FE  Take 1 tablet by mouth daily.     oxyCODONE 5 MG immediate release tablet  Commonly known as:  ROXICODONE  Take one tablet by mouth every 6 hours as needed for pain     Rivaroxaban 20 MG Tabs tablet  Commonly known as:  XARELTO  Take 1 tablet (20 mg total) by mouth daily with supper.     senna-docusate 8.6-50 MG per tablet  Commonly known as:  SENOKOT S  Take 1 tablet by mouth at bedtime.     sertraline 100 MG tablet  Commonly known as:  ZOLOFT  Take 100 mg by mouth at bedtime.     Vitamin D-3 1000 UNITS Caps  Take 1 capsule by mouth daily.        Meds ordered this encounter  Medications  . sertraline (ZOLOFT) 100 MG tablet  Sig: Take 100 mg by mouth at bedtime.  . divalproex (DEPAKOTE) 500 MG DR tablet    Sig: Take 500 mg by mouth 2 (two) times daily.    Immunization History  Administered Date(s) Administered  . Influenza-Unspecified 07/23/2013  . PPD Test 08/30/2011  . Pneumococcal-Unspecified 08/09/2012  . Tdap 08/27/2011    History  Substance Use Topics  . Smoking status: Former Smoker    Types: Cigarettes  . Smokeless tobacco: Never Used  . Alcohol Use: No    Review of Systems  DATA OBTAINED: from, nurse, GENERAL:  no fevers, fatigue, appetite changes SKIN: No itching, rash HEENT: No complaint RESPIRATORY: No cough, wheezing, SOB CARDIAC: No chest pain, palpitations, lower extremity edema  GI: No abdominal pain, No N/V/D or constipation, No heartburn or reflux  GU: No dysuria, frequency or urgency, or incontinence  MUSCULOSKELETAL: No unrelieved bone/joint pain NEUROLOGIC: No  headache, dizziness or focal weakness PSYCHIATRIC: Not sleeping well, behavoirs  Filed Vitals:   12/15/13 2330  BP: 126/73  Pulse: 63  Temp: 97.1 F (36.2 C)  Resp: 18    Physical Exam  GENERAL APPEARANCE: Alert, minconversant. Appropriately groomed. No acute distress calm SKIN: No diaphoresis rash HEENT: Unremarkable RESPIRATORY: Breathing is even, unlabored. Lung sounds are clear   CARDIOVASCULAR: Heart RRR no murmurs, rubs or gallops. No peripheral edema  GASTROINTESTINAL: Abdomen is soft, non-tender, not distended w/ normal bowel sounds.  GENITOURINARY: Bladder non tender, not distended  MUSCULOSKELETAL: No abnormal joints or musculature NEUROLOGIC: Cranial nerves 2-12 grossly intact. Moves all extremities no tremor. PSYCHIATRIC: dementia  Patient Active Problem List   Diagnosis Date Noted  . Unspecified gastritis and gastroduodenitis without mention of hemorrhage 10/31/2013  . Leg edema, right 10/30/2013  . Syncope and collapse 10/30/2013  . Leg swelling 10/30/2013  . Protein-calorie malnutrition, severe 10/30/2013  . DVT, femoral, acute 10/30/2013  . Esophageal thickening 10/30/2013  . Nonspecific (abnormal) findings on radiological and other examination of gastrointestinal tract 10/30/2013  . Hypernatremia 09/10/2013  . UTI (urinary tract infection) 09/10/2013  . Unspecified constipation 01/21/2013  . Depression 01/20/2013  . Late effects of CVA (cerebrovascular accident) 01/20/2013  . Hypokalemia 08/30/2011  . Microcytic anemia 08/28/2011  . Syncope 08/27/2011  . Dementia without behavioral disturbance 08/27/2011  . HTN (hypertension) 08/27/2011  . Diabetes mellitus 08/27/2011  . Seizure disorder 08/27/2011    CBC    Component Value Date/Time   WBC 4.2 11/03/2013 0640   RBC 3.59* 11/03/2013 0640   RBC 4.25 11/05/2009 2050   HGB 8.7* 11/03/2013 0640   HCT 27.8* 11/03/2013 0640   PLT 336 11/03/2013 0640   MCV 77.4* 11/03/2013 0640   LYMPHSABS 2.2 10/29/2013  2020   MONOABS 0.6 10/29/2013 2020   EOSABS 0.2 10/29/2013 2020   BASOSABS 0.1 10/29/2013 2020    CMP     Component Value Date/Time   NA 145 11/03/2013 0640   K 4.2 11/03/2013 0640   CL 103 11/03/2013 0640   CO2 31 11/03/2013 0640   GLUCOSE 80 11/03/2013 0640   BUN 19 11/03/2013 0640   CREATININE 0.61 11/03/2013 0640   CALCIUM 8.8 11/03/2013 0640   PROT 7.0 01/20/2013 1459   ALBUMIN 3.6 01/20/2013 1459   AST 21 01/20/2013 1459   ALT 10 01/20/2013 1459   ALKPHOS 90 01/20/2013 1459   BILITOT 0.2* 01/20/2013 1459   GFRNONAA 85* 11/03/2013 0640   GFRAA >90 11/03/2013 0640    Assessment and Plan  MICROCYTIC ANEMIA - fe 21, ferritin 17.  Will inc ferrous sulfate to BID  Margit Hanks, MD

## 2013-12-15 NOTE — Assessment & Plan Note (Signed)
Iron studies returned -Fe 21, ferritin is 17! Will inc ferrous sulfate to BID

## 2014-01-02 ENCOUNTER — Non-Acute Institutional Stay (SKILLED_NURSING_FACILITY): Payer: Medicare Other | Admitting: Nurse Practitioner

## 2014-01-02 DIAGNOSIS — I824Y9 Acute embolism and thrombosis of unspecified deep veins of unspecified proximal lower extremity: Secondary | ICD-10-CM

## 2014-01-02 DIAGNOSIS — I10 Essential (primary) hypertension: Secondary | ICD-10-CM

## 2014-01-02 DIAGNOSIS — F0390 Unspecified dementia without behavioral disturbance: Secondary | ICD-10-CM

## 2014-01-02 DIAGNOSIS — D509 Iron deficiency anemia, unspecified: Secondary | ICD-10-CM

## 2014-01-02 DIAGNOSIS — I82419 Acute embolism and thrombosis of unspecified femoral vein: Secondary | ICD-10-CM

## 2014-01-02 DIAGNOSIS — I6990 Unspecified sequelae of unspecified cerebrovascular disease: Secondary | ICD-10-CM

## 2014-01-02 DIAGNOSIS — G40909 Epilepsy, unspecified, not intractable, without status epilepticus: Secondary | ICD-10-CM

## 2014-01-02 NOTE — Progress Notes (Signed)
Patient ID: Sheryl DuffMary Jones, female   DOB: 06/04/1934, 80 y.o.   MRN: [id number]    Nursing Home Location:  [facility]   Place of Service: SNF (31)  PCP: Sheryl EinsteinASANAYAKA,GAYANI, MD  No Known Allergies  Chief Complaint  Patient presents with  . Medical Managment of Chronic Issues    HPI:  80 year old [facility] who is being seen today for routine follow up on chronic conditions. Pt with severe dementia, DM, HTN, DVT with chronic leg swelling, depression, anemia, protein calorie malnutrition. There has been no change in the last month and staff without concerns at this time; Pt unable to contribute to HPI or ROS due to dementia, staff without any concerns at this time  Review of Systems:  Review of Systems  Unable to perform ROS: dementia     Past Medical History  Diagnosis Date  . Dementia   . Hypertension   . Diabetes mellitus   . Iron deficiency anemia   . Hypokalemia   . Depressive disorder   . Anxiety   . Unspecified epilepsy without mention of intractable epilepsy   . Other and unspecified hyperlipidemia   . Colon polyp   . Iron deficiency anemia 10/2009   Past Surgical History  Procedure Laterality Date  . Esophagogastroduodenoscopy N/A 10/31/2013    Procedure: ESOPHAGOGASTRODUODENOSCOPY (EGD);  Surgeon: Rachael Feeaniel P Jacobs, MD;  [facility];  Service: Endoscopy;  Laterality: N/A;   Social History:   reports that she has quit smoking. Her smoking use included Cigarettes. She smoked 0.00 packs per day. She has never used smokeless tobacco. She reports that she does not drink alcohol or use illicit drugs.  Family History  Problem Relation Age of Onset  . Heart attack Mother   . Heart attack Father   . Breast cancer Sister     meta  . Hypertension Father   . Hypertension Daughter     Medications: Patient's Medications  New Prescriptions   No medications on file  Previous Medications   ACETAMINOPHEN (TYLENOL) 500 MG  TABLET    Take 1,000 mg by mouth every 8 (eight) hours as needed for mild pain.    Lisinopril 10 mg daily    Take 10 mg by mouth daily.   ASPIRIN 81 MG CHEWABLE TABLET    Chew 81 mg by mouth daily.   CHOLECALCIFEROL (VITAMIN D-3) 1000 UNITS CAPS    Take 1 capsule by mouth daily.   DIVALPROEX (DEPAKOTE) 500 MG DR TABLET    Take 500 mg by mouth 2 (two) times daily.   DONEPEZIL (ARICEPT ODT) 10 MG DISINTEGRATING TABLET    Take 10 mg by mouth at bedtime.   FEEDING SUPPLEMENT, ENSURE COMPLETE, (ENSURE COMPLETE) LIQD    Take 237 mLs by mouth 2 (two) times daily between meals.   FEEDING SUPPLEMENT, ENSURE, (ENSURE) PUDG    Take 1 Container by mouth 2 (two) times daily between meals.   FERROUS FUMARATE (HEMOCYTE - 106 MG FE) 325 (106 FE) MG TABS TABLET    Take 1 tablet by mouth 2 (two) times daily.    MENTHOL, TOPICAL ANALGESIC, (BENGAY EX)    Apply 1 application topically 2 (two) times daily as needed. For knee pain   OXYCODONE (ROXICODONE) 5 MG IMMEDIATE RELEASE TABLET    Take one tablet by mouth every 6 hours as needed for pain   RIVAROXABAN (XARELTO) 20 MG TABS TABLET    Take 1 tablet (20 mg  total) by mouth daily with supper.   SENNA-DOCUSATE (SENOKOT S) 8.6-50 MG PER TABLET    Take 1 tablet by mouth at bedtime.   SERTRALINE (ZOLOFT)50 MG TABLET    Take 50 mg by mouth at bedtime.  Modified Medications   No medications on file  Discontinued Medications   No medications on file     Physical Exam:  Filed Vitals:   01/02/14 1204  BP: 120/75  Pulse: 78  Temp: 98.6 F (37 C)  Resp: 20  Weight: 169 lb (76.658 kg)    Physical Exam  Constitutional:  Nonverbal female in NAD  HENT:  Head: Normocephalic and atraumatic.  Mouth/Throat: Oropharynx is clear and moist. No oropharyngeal exudate.  Eyes: Conjunctivae and EOM are normal. Pupils are equal, round, and reactive to light.  Neck: Normal range of motion. Neck supple. No thyromegaly present.  Cardiovascular: Normal rate, regular rhythm and  normal heart sounds.   Pulmonary/Chest: Effort normal and breath sounds normal.  Abdominal: Soft. Bowel sounds are normal. She exhibits no distension.  Musculoskeletal: She exhibits edema (right greater than left; not pitting). She exhibits no tenderness.  Lymphadenopathy:    She has no cervical adenopathy.  Neurological: She is alert.  Contractures to bilateral hands- chronic  Skin: Skin is warm and dry. No erythema.  Psychiatric: Affect normal.     Labs reviewed: Basic Metabolic Panel:  Recent Labs  16/10/96 1459 01/20/13 1507 01/20/13 2116 10/29/13 2020 11/03/13 0640  NA 143 143  --  147 145  K 4.3 4.1  --  3.7 4.2  CL 103 107  --  102 103  CO2 24  --   --  29 31  GLUCOSE 121* 126*  --  141* 80  BUN 20 22  --  14 19  CREATININE 0.74 0.80 0.60 0.64 0.61  CALCIUM 9.4  --   --  9.2 8.8   Liver Function Tests:  Recent Labs  01/20/13 1459  AST 21  ALT 10  ALKPHOS 90  BILITOT 0.2*  PROT 7.0  ALBUMIN 3.6   No results found for this basename: LIPASE, AMYLASE,  in the last 8760 hours No results found for this basename: AMMONIA,  in the last 8760 hours CBC:  Recent Labs  01/20/13 1459  10/29/13 2020 10/31/13 0542 11/03/13 0640  WBC 9.8  < > 5.7 5.3 4.2  NEUTROABS 3.5  --  2.6  --   --   HGB 9.1*  < > 10.0* 8.7* 8.7*  HCT 30.0*  < > 32.8* 28.6* 27.8*  MCV 66.1*  < > 79.0 79.7 77.4*  PLT 469*  < > 378 328 336  < > = values in this interval not displayed. Cardiac Enzymes:  Recent Labs  01/20/13 1459 10/30/13 0540 10/30/13 1220 10/30/13 1555  CKTOTAL 43  --   --   --   CKMB 1.6  --   --   --   TROPONINI <0.30 <0.30 <0.30 <0.30   BNP: No components found with this basename: POCBNP,  CBG:  Recent Labs  11/02/13 1614 11/02/13 2007 11/03/13 0743  GLUCAP 114* 137* 78   TSH:  Recent Labs  01/20/13 2116  TSH 1.307   A1C: Lab Results  Component Value Date   HGBA1C 6.3* 01/21/2013   Lipid Panel:  Recent Labs  01/21/13 0443  CHOL 212*    HDL 66  LDLCALC 129*  TRIG 86  CHOLHDL 3.2   Basic Metabolic Panel    Result: 12/17/2013 3:10  PM   ( Status: F )     C Sodium 151   H 135-145 mEq/L SLN   Potassium 3.3   L 3.5-5.3 mEq/L SLN   Chloride 111     96-112 mEq/L SLN   CO2 34   H 19-32 mEq/L SLN   Glucose 81     70-99 mg/dL SLN   BUN 15     1-61 mg/dL SLN   Creatinine 0.96     0.50-1.10 mg/dL SLN   Calcium 9.0   CBC NO Diff (Complete Blood Count)    Result: 12/30/2013 3:24 PM   ( Status: F )     C WBC 4.7     4.0-10.5 K/uL SLN   RBC 4.04     3.87-5.11 MIL/uL SLN   Hemoglobin 9.5   L 12.0-15.0 g/dL SLN   Hematocrit 04.5   L 36.0-46.0 % SLN   MCV 76.2   L 78.0-100.0 fL SLN   MCH 23.5   L 26.0-34.0 pg SLN   MCHC 30.8     30.0-36.0 g/dL SLN   RDW 40.9   H 81.1-91.4 % SLN   Platelet Count 361     150-400 K/uL SLN   Valproic Acid (Depakene)    Result: 12/30/2013 4:12 PM   ( Status: F )       Valproic Acid (Depakene) 31.7   L 50.0-100.0 ug/mL SLN   Assessment/Plan 1. HTN (hypertension) -stable on lisinopril   2. DVT, femoral, acute -stable on xarelto  3. Dementia without behavioral disturbance -conts on aircept   4. Microcytic anemia conts on iron BID -will follow up cbc in 1 month  5. Late effects of CVA (cerebrovascular accident) conts on ASA; on xarelto due to DVT; pt does not talk and has contractures but no changes in status.   6. Seizure disorder -Depakene level low; taking 500 twice daily due to low albumin will get free depakene level   7. Hypokalemia -bmp was ordered for redraw- was not done, will get BMP today

## 2014-02-03 ENCOUNTER — Non-Acute Institutional Stay (SKILLED_NURSING_FACILITY): Payer: Medicare Other | Admitting: Nurse Practitioner

## 2014-02-03 DIAGNOSIS — D509 Iron deficiency anemia, unspecified: Secondary | ICD-10-CM | POA: Insufficient documentation

## 2014-02-03 DIAGNOSIS — D649 Anemia, unspecified: Secondary | ICD-10-CM

## 2014-02-03 DIAGNOSIS — F329 Major depressive disorder, single episode, unspecified: Secondary | ICD-10-CM

## 2014-02-03 DIAGNOSIS — F3289 Other specified depressive episodes: Secondary | ICD-10-CM

## 2014-02-03 DIAGNOSIS — F0390 Unspecified dementia without behavioral disturbance: Secondary | ICD-10-CM

## 2014-02-03 DIAGNOSIS — I82419 Acute embolism and thrombosis of unspecified femoral vein: Secondary | ICD-10-CM

## 2014-02-03 DIAGNOSIS — I824Y9 Acute embolism and thrombosis of unspecified deep veins of unspecified proximal lower extremity: Secondary | ICD-10-CM

## 2014-02-03 DIAGNOSIS — I6990 Unspecified sequelae of unspecified cerebrovascular disease: Secondary | ICD-10-CM

## 2014-02-03 DIAGNOSIS — I10 Essential (primary) hypertension: Secondary | ICD-10-CM

## 2014-02-03 DIAGNOSIS — F32A Depression, unspecified: Secondary | ICD-10-CM

## 2014-02-03 NOTE — Progress Notes (Signed)
Patient ID: Sheryl Jones, female   DOB: [birth year], 80 y.o.   MRN: [id number]    Nursing Home Location:  [facility]   Place of Service: SNF (31)  PCP: Karlene Einstein, MD  No Known Allergies  Chief Complaint  Patient presents with  . Medical Managment of Chronic Issues    HPI:  80 year old female who is at [facility] for LTC who is being seen today for routine follow up on chronic conditions. Pt pmh of dementia, DM, HTN, DVT with chronic leg swelling, depression, anemia, protein calorie malnutrition. There has been no change in the last month and staff without concerns at this time; Pt unable to contribute to HPI or ROS due to dementia  Review of Systems:  Unable to perform due to dementia  Past Medical History  Diagnosis Date  . Dementia   . Hypertension   . Diabetes mellitus   . Iron deficiency anemia   . Hypokalemia   . Depressive disorder   . Anxiety   . Unspecified epilepsy without mention of intractable epilepsy   . Other and unspecified hyperlipidemia   . Colon polyp   . Iron deficiency anemia 10/2009   Past Surgical History  Procedure Laterality Date  . Esophagogastroduodenoscopy N/A 10/31/2013    Procedure: ESOPHAGOGASTRODUODENOSCOPY (EGD);  Surgeon: Rachael Fee, MD;  Location: [facility] ENDOSCOPY;  Service: Endoscopy;  Laterality: N/A;   Social History:   reports that she has quit smoking. Her smoking use included Cigarettes. She smoked 0.00 packs per day. She has never used smokeless tobacco. She reports that she does not drink alcohol or use illicit drugs.  Family History  Problem Relation Age of Onset  . Heart attack Mother   . Heart attack Father   . Breast cancer Sister     meta  . Hypertension Father   . Hypertension Daughter     Medications: Patient's Medications  New Prescriptions   No medications on file  Previous Medications   ACETAMINOPHEN (TYLENOL) 500 MG TABLET    Take 1,000 mg by mouth every 8 (eight) hours as needed for mild  pain.    ASPIRIN 81 MG CHEWABLE TABLET    Chew 81 mg by mouth daily.   CHOLECALCIFEROL (VITAMIN D-3) 1000 UNITS CAPS    Take 1 capsule by mouth daily.   DIVALPROEX (DEPAKOTE) 500 MG DR TABLET    Take 500 mg by mouth 2 (two) times daily.   DONEPEZIL (ARICEPT ODT) 10 MG DISINTEGRATING TABLET    Take 10 mg by mouth at bedtime.   FEEDING SUPPLEMENT, ENSURE COMPLETE, (ENSURE COMPLETE) LIQD    Take 237 mLs by mouth 2 (two) times daily between meals.   FEEDING SUPPLEMENT, ENSURE, (ENSURE) PUDG    Take 1 Container by mouth 2 (two) times daily between meals.   FERROUS FUMARATE (HEMOCYTE - 106 MG FE) 325 (106 FE) MG TABS TABLET    Take 1 tablet by mouth 2 (two) times daily.    LISINOPRIL (PRINIVIL,ZESTRIL) 10 MG TABLET    Take 10 mg by mouth daily.   MENTHOL, TOPICAL ANALGESIC, (BENGAY EX)    Apply 1 application topically 2 (two) times daily as needed. For knee pain   OXYCODONE (ROXICODONE) 5 MG IMMEDIATE RELEASE TABLET    Take one tablet by mouth every 6 hours as needed for pain   RIVAROXABAN (XARELTO) 20 MG TABS TABLET    Take 1 tablet (20 mg total) by mouth daily with supper.   SENNA-DOCUSATE (SENOKOT S) 8.6-50  MG PER TABLET    Take 1 tablet by mouth at bedtime.   SERTRALINE (ZOLOFT) 100 MG TABLET    Take 50 mg by mouth at bedtime.   Modified Medications   No medications on file  Discontinued Medications   No medications on file     Physical Exam:  Filed Vitals:   02/03/14 1241  BP: 158/88  Pulse: 72  Resp: 20  SpO2: 100%    Physical Exam  Constitutional:  Nonverbal female in NAD  Cardiovascular: Normal rate, regular rhythm and normal heart sounds.   Pulmonary/Chest: Effort normal and breath sounds normal.  Abdominal: Soft. Bowel sounds are normal. She exhibits no distension.  Musculoskeletal: She exhibits edema. She exhibits no tenderness.  Neurological: She is alert.  Contractures to bilateral hands- chronic  Skin: Skin is warm and dry. No erythema.  Psychiatric: Affect normal.      Labs reviewed: Basic Metabolic Panel:  Recent Labs  16/07/9600/07/15 2020 11/03/13 0640  NA 147 145  K 3.7 4.2  CL 102 103  CO2 29 31  GLUCOSE 141* 80  BUN 14 19  CREATININE 0.64 0.61  CALCIUM 9.2 8.8   Liver Function Tests: No results found for this basename: AST, ALT, ALKPHOS, BILITOT, PROT, ALBUMIN,  in the last 8760 hours No results found for this basename: LIPASE, AMYLASE,  in the last 8760 hours No results found for this basename: AMMONIA,  in the last 8760 hours CBC:  Recent Labs  10/29/13 2020 10/31/13 0542 11/03/13 0640  WBC 5.7 5.3 4.2  NEUTROABS 2.6  --   --   HGB 10.0* 8.7* 8.7*  HCT 32.8* 28.6* 27.8*  MCV 79.0 79.7 77.4*  PLT 378 328 336   Cardiac Enzymes:  Recent Labs  10/30/13 0540 10/30/13 1220 10/30/13 1555  TROPONINI <0.30 <0.30 <0.30   BNP: No components found with this basename: POCBNP,  CBG:  Recent Labs  11/02/13 1614 11/02/13 2007 11/03/13 0743  GLUCAP 114* 137* 78    Basic Metabolic Panel  Result: 12/17/2013 3:10 PM ( Status: F ) C  Sodium 151 H 135-145 mEq/L SLN  Potassium 3.3 L 3.5-5.3 mEq/L SLN  Chloride 111 96-112 mEq/L SLN  CO2 34 H 19-32 mEq/L SLN  Glucose 81 70-99 mg/dL SLN  BUN 15 0-456-23 mg/dL SLN  Creatinine 4.090.61 8.11-9.140.50-1.10 mg/dL SLN  Calcium 9.0  CBC NO Diff (Complete Blood Count)  Result: 12/30/2013 3:24 PM ( Status: F ) C  WBC 4.7 4.0-10.5 K/uL SLN  RBC 4.04 3.87-5.11 MIL/uL SLN  Hemoglobin 9.5 L 12.0-15.0 g/dL SLN  Hematocrit 78.230.8 L 36.0-46.0 % SLN  MCV 76.2 L 78.0-100.0 fL SLN  MCH 23.5 L 26.0-34.0 pg SLN  MCHC 30.8 30.0-36.0 g/dL SLN  RDW 95.617.1 H 21.3-08.611.5-15.5 % SLN  Platelet Count 361 150-400 K/uL SLN  Valproic Acid (Depakene)  Result: 12/30/2013 4:12 PM ( Status: F )  Valproic Acid (Depakene) 31.7 L 50.0-100.0 ug/mL SLN  CBC NO Diff (Complete Blood Count)    Result: 01/28/2014 2:49 PM   ( Status: F )       WBC 4.5     4.0-10.5 K/uL SLN   RBC 4.09     3.87-5.11 MIL/uL SLN   Hemoglobin 9.7    L 12.0-15.0 g/dL SLN   Hematocrit 57.829.5   L 36.0-46.0 % SLN   MCV 72.1   L 78.0-100.0 fL SLN   MCH 23.7   L 26.0-34.0 pg SLN   MCHC 32.9     30.0-36.0 g/dL  SLN   RDW 20.0   H 11.5-15.5 % SLN   Platelet Count 312     150-400 K/uL SLN   Comprehensive Metabolic Panel    Result: 01/28/2014 3:20 PM   ( Status: F )       Sodium 148   H 135-145 mEq/L SLN   Potassium 3.4   L 3.5-5.3 mEq/L SLN   Chloride 107     96-112 mEq/L SLN   CO2 31     19-32 mEq/L SLN   Glucose 81     70-99 mg/dL SLN   BUN 16     1-616-23 mg/dL SLN   Creatinine 0.960.62     0.50-1.10 mg/dL SLN   Bilirubin, Total 0.3     0.2-1.2 mg/dL SLN   Alkaline Phosphatase 42     39-117 U/L SLN   AST/SGOT 11     0-37 U/L SLN   ALT/SGPT 10     0-35 U/L SLN   Total Protein 5.1   L 6.0-8.3 g/dL SLN   Albumin 3.1   L 0.4-5.43.5-5.2 g/dL SLN   Calcium 8.7     0.9-81.18.4-10.5 mg/dL SLN   Valproic Acid (Depakene)    Result: 01/28/2014 3:19 PM   ( Status: F )       Valproic Acid (Depakene) 70.0     50.0-100.0 ug/mL SL Assessment/Plan 1. HTN (hypertension) -elevated today; will have staff get VS daily for closer monitoring and make changes if needed   2. Dementia without behavioral disturbance -stable without significant change in the last months, conts on aricept  3. Depression -without signs of depression or behaviors, conts on zoloft  4. Late effects of CVA (cerebrovascular accident) -stable; without acute changes, conts on ASA and antihypertensives  5. DVT, femoral, acute -conts on xarelto, no worsening edema or heat  6. Anemia -hgb remains stable; conts on iron supplements

## 2014-03-03 ENCOUNTER — Encounter: Payer: Self-pay | Admitting: Nurse Practitioner

## 2014-03-03 ENCOUNTER — Non-Acute Institutional Stay (SKILLED_NURSING_FACILITY): Payer: Medicare Other | Admitting: Nurse Practitioner

## 2014-03-03 DIAGNOSIS — F0390 Unspecified dementia without behavioral disturbance: Secondary | ICD-10-CM

## 2014-03-03 DIAGNOSIS — F32A Depression, unspecified: Secondary | ICD-10-CM

## 2014-03-03 DIAGNOSIS — F3289 Other specified depressive episodes: Secondary | ICD-10-CM

## 2014-03-03 DIAGNOSIS — D649 Anemia, unspecified: Secondary | ICD-10-CM

## 2014-03-03 DIAGNOSIS — I10 Essential (primary) hypertension: Secondary | ICD-10-CM

## 2014-03-03 DIAGNOSIS — F329 Major depressive disorder, single episode, unspecified: Secondary | ICD-10-CM

## 2014-03-03 DIAGNOSIS — G40909 Epilepsy, unspecified, not intractable, without status epilepticus: Secondary | ICD-10-CM

## 2014-03-03 DIAGNOSIS — I6990 Unspecified sequelae of unspecified cerebrovascular disease: Secondary | ICD-10-CM

## 2014-03-03 MED ORDER — SERTRALINE HCL 25 MG PO TABS: 25.0000 mg | ORAL_TABLET | Freq: Every day | ORAL | Status: DC

## 2014-03-03 NOTE — Progress Notes (Signed)
Patient ID: Sheryl Jones, female   DOB: [birth year], 80 y.o.   MRN: [id number]    Nursing Home Location:  [facility]   Place of Service: SNF (31)  PCP: Karlene Einstein, MD  No Known Allergies  Chief Complaint  Patient presents with  . Medical Management of Chronic Issues    HPI:  80 year old female who is at [facility] for LTC who is being seen today for routine follow up on chronic conditions. Pt pmh of dementia, DM, HTN, DVT with chronic leg swelling, depression, anemia, protein calorie malnutrition. There has been no change in the last month and staff without concerns at this time; Pt unable to contribute to HPI or ROS due to dementia  Review of Systems:  Unable to obtain  Past Medical History  Diagnosis Date  . Dementia   . Hypertension   . Diabetes mellitus   . Iron deficiency anemia   . Hypokalemia   . Depressive disorder   . Anxiety   . Unspecified epilepsy without mention of intractable epilepsy   . Other and unspecified hyperlipidemia   . Colon polyp   . Iron deficiency anemia 10/2009   Past Surgical History  Procedure Laterality Date  . Esophagogastroduodenoscopy N/A 10/31/2013    Procedure: ESOPHAGOGASTRODUODENOSCOPY (EGD);  Surgeon: Rachael Fee, MD;  Location: [facility] ENDOSCOPY;  Service: Endoscopy;  Laterality: N/A;   Social History:   reports that she has quit smoking. Her smoking use included Cigarettes. She smoked 0.00 packs per day. She has never used smokeless tobacco. She reports that she does not drink alcohol or use illicit drugs.  Family History  Problem Relation Age of Onset  . Heart attack Mother   . Heart attack Father   . Breast cancer Sister     meta  . Hypertension Father   . Hypertension Daughter     Medications: Patient's Medications  New Prescriptions   No medications on file  Previous Medications   ACETAMINOPHEN (TYLENOL) 500 MG TABLET    Take 1,000 mg by mouth every 8 (eight) hours as needed for mild pain.    ASPIRIN 81 MG CHEWABLE TABLET    Chew 81 mg by mouth daily.   CHOLECALCIFEROL (VITAMIN D-3) 1000 UNITS CAPS    Take 1 capsule by mouth daily.   DIVALPROEX (DEPAKOTE) 500 MG DR TABLET    Take 500 mg by mouth 2 (two) times daily.   DONEPEZIL (ARICEPT ODT) 10 MG DISINTEGRATING TABLET    Take 10 mg by mouth at bedtime.   FEEDING SUPPLEMENT, ENSURE COMPLETE, (ENSURE COMPLETE) LIQD    Take 237 mLs by mouth 2 (two) times daily between meals.   FEEDING SUPPLEMENT, ENSURE, (ENSURE) PUDG    Take 1 Container by mouth 2 (two) times daily between meals.   FERROUS FUMARATE (HEMOCYTE - 106 MG FE) 325 (106 FE) MG TABS TABLET    Take 1 tablet by mouth 2 (two) times daily.    LISINOPRIL (PRINIVIL,ZESTRIL) 10 MG TABLET    Take 10 mg by mouth daily.   MENTHOL, TOPICAL ANALGESIC, (BENGAY EX)    Apply 1 application topically 2 (two) times daily as needed. For knee pain   OXYCODONE (ROXICODONE) 5 MG IMMEDIATE RELEASE TABLET    Take one tablet by mouth every 6 hours as needed for pain   RIVAROXABAN (XARELTO) 20 MG TABS TABLET    Take 1 tablet (20 mg total) by mouth daily with supper.   SENNA-DOCUSATE (SENOKOT S) 8.6-50 MG PER TABLET  Take 1 tablet by mouth at bedtime.   SERTRALINE (ZOLOFT) 100 MG TABLET    Take 50 mg by mouth at bedtime.   Modified Medications   No medications on file  Discontinued Medications   No medications on file     Physical Exam:   Filed Vitals:   03/03/14 1332  BP: 127/82  Pulse: 85  Temp: 97.8 F (36.6 C)  Resp: 18   Physical Exam  Constitutional:  Nonverbal female in NAD  Neck: Normal range of motion. Neck supple.  Cardiovascular: Normal rate, regular rhythm and normal heart sounds.   Pulmonary/Chest: Effort normal and breath sounds normal.  Abdominal: Soft. Bowel sounds are normal. She exhibits no distension.  Musculoskeletal: She exhibits edema (chronic wihout change). She exhibits no tenderness.  Neurological: She is alert.  Contractures to bilateral hands- chronic    Skin: Skin is warm and dry.  Psychiatric: Affect normal.      Labs reviewed: Basic Metabolic Panel:  Recent Labs  69/62/9500/05/06 2020 11/03/13 0640  NA 147 145  K 3.7 4.2  CL 102 103  CO2 29 31  GLUCOSE 141* 80  BUN 14 19  CREATININE 0.64 0.61  CALCIUM 9.2 8.8   Liver Function Tests: No results found for this basename: AST, ALT, ALKPHOS, BILITOT, PROT, ALBUMIN,  in the last 8760 hours No results found for this basename: LIPASE, AMYLASE,  in the last 8760 hours No results found for this basename: AMMONIA,  in the last 8760 hours CBC:  Recent Labs  10/29/13 2020 10/31/13 0542 11/03/13 0640  WBC 5.7 5.3 4.2  NEUTROABS 2.6  --   --   HGB 10.0* 8.7* 8.7*  HCT 32.8* 28.6* 27.8*  MCV 79.0 79.7 77.4*  PLT 378 328 336   Cardiac Enzymes:  Recent Labs  10/30/13 0540 10/30/13 1220 10/30/13 1555  TROPONINI <0.30 <0.30 <0.30   BNP: No components found with this basename: POCBNP,  CBG:  Recent Labs  11/02/13 1614 11/02/13 2007 11/03/13 0743  GLUCAP 114* 137* 78   TSH: No results found for this basename: TSH,  in the last 8760 hours A1C: Lab Results  Component Value Date   HGBA1C 6.3* 01/21/2013   Basic Metabolic Panel  Result: 12/17/2013 3:10 PM ( Status: F ) C  Sodium 151 H 135-145 mEq/L SLN  Potassium 3.3 L 3.5-5.3 mEq/L SLN  Chloride 111 96-112 mEq/L SLN  CO2 34 H 19-32 mEq/L SLN  Glucose 81 70-99 mg/dL SLN  BUN 15 2-846-23 mg/dL SLN  Creatinine 1.320.61 4.40-1.020.50-1.10 mg/dL SLN  Calcium 9.0  CBC NO Diff (Complete Blood Count)  Result: 12/30/2013 3:24 PM ( Status: F ) C  WBC 4.7 4.0-10.5 K/uL SLN  RBC 4.04 3.87-5.11 MIL/uL SLN  Hemoglobin 9.5 L 12.0-15.0 g/dL SLN  Hematocrit 72.530.8 L 36.0-46.0 % SLN  MCV 76.2 L 78.0-100.0 fL SLN  MCH 23.5 L 26.0-34.0 pg SLN  MCHC 30.8 30.0-36.0 g/dL SLN  RDW 36.617.1 H 44.0-34.711.5-15.5 % SLN  Platelet Count 361 150-400 K/uL SLN  Valproic Acid (Depakene)  Result: 12/30/2013 4:12 PM ( Status: F )  Valproic Acid (Depakene) 31.7 L 50.0-100.0  ug/mL SLN  CBC NO Diff (Complete Blood Count)  Result: 01/28/2014 2:49 PM ( Status: F )  WBC 4.5 4.0-10.5 K/uL SLN  RBC 4.09 3.87-5.11 MIL/uL SLN  Hemoglobin 9.7 L 12.0-15.0 g/dL SLN  Hematocrit 42.529.5 L 36.0-46.0 % SLN  MCV 72.1 L 78.0-100.0 fL SLN  MCH 23.7 L 26.0-34.0 pg SLN  MCHC 32.9 30.0-36.0 g/dL  SLN  RDW 20.0 H 11.5-15.5 % SLN  Platelet Count 312 150-400 K/uL SLN  Comprehensive Metabolic Panel  Result: 01/28/2014 3:20 PM ( Status: F )  Sodium 148 H 135-145 mEq/L SLN  Potassium 3.4 L 3.5-5.3 mEq/L SLN  Chloride 107 96-112 mEq/L SLN  CO2 31 19-32 mEq/L SLN  Glucose 81 70-99 mg/dL SLN  BUN 16 1-61 mg/dL SLN  Creatinine 0.96 0.45-4.09 mg/dL SLN  Bilirubin, Total 0.3 0.2-1.2 mg/dL SLN  Alkaline Phosphatase 42 39-117 U/L SLN  AST/SGOT 11 0-37 U/L SLN  ALT/SGPT 10 0-35 U/L SLN  Total Protein 5.1 L 6.0-8.3 g/dL SLN  Albumin 3.1 L 8.1-1.9 g/dL SLN  Calcium 8.7 1.4-78.2 mg/dL SLN  Valproic Acid (Depakene)  Result: 01/28/2014 3:19 PM ( Status: F )  Valproic Acid (Depakene) 70.0 50.0-100.0 ug/mL SL CMP with Estimated GFR    Result: 02/09/2014 10:25 AM   ( Status: F )       Sodium 148   H 135-145 mEq/L SLN   Potassium 3.7     3.5-5.3 mEq/L SLN   Chloride 107     96-112 mEq/L SLN   CO2 35   H 19-32 mEq/L SLN   Glucose 73     70-99 mg/dL SLN   BUN 18     9-56 mg/dL SLN   Creatinine 2.13     0.50-1.10 mg/dL SLN   Bilirubin, Total 0.3     0.2-1.2 mg/dL SLN   Alkaline Phosphatase 49     39-117 U/L SLN   AST/SGOT 10     0-37 U/L SLN   ALT/SGPT 8     0-35 U/L SLN   Total Protein 5.4   L 6.0-8.3 g/dL SLN   Albumin 3.4   L 0.8-6.5 g/dL SLN   Calcium 9.6     7.8-46.9 mg/dL SLN   Est GFR, African American >89      mL/min SLN   Est GFR, NonAfrican American 84      mL/min SLN C CBC NO Diff (Complete Blood Count)    Result: 02/09/2014 12:00 PM   ( Status: F )       WBC 5.1     4.0-10.5 K/uL SLN   RBC 4.40     3.87-5.11 MIL/uL SLN   Hemoglobin 10.4   L 12.0-15.0 g/dL SLN   Hematocrit 62.9    L 36.0-46.0 % SLN   MCV 74.1   L 78.0-100.0 fL SLN   MCH 23.6   L 26.0-34.0 pg SLN   MCHC 31.9     30.0-36.0 g/dL SLN   RDW 52.8   H 41.3-24.4 % SLN   Platelet Count 292     150-400 K/uL SLN CBC NO Diff (Complete Blood Count)    Result: 03/03/2014 8:15 AM   ( Status: F )     C WBC 4.5     4.0-10.5 K/uL SLN   RBC 3.97     3.87-5.11 MIL/uL SLN   Hemoglobin 9.5   L 12.0-15.0 g/dL SLN   Hematocrit 01.0   L 36.0-46.0 % SLN   MCV 74.6   L 78.0-100.0 fL SLN   MCH 23.9   L 26.0-34.0 pg SLN   MCHC 32.1     30.0-36.0 g/dL SLN   RDW 27.2   H 53.6-64.4 % SLN   Platelet Count 336     150-400 K/uL   Assessment/Plan 1. HTN (hypertension) -some elevations in blood pressure but overall controlled, will cont current medications  2. Dementia without  behavioral disturbance -pt is total care, requires assist in all activities, will stop aricept at this time.   3. Seizure disorder -currently on depakote, no recurrent seizures noted  4. Anemia -will get iron panel  5. Late effects of CVA (cerebrovascular accident) -conts on asa  6. Depression -no signs of depression/anxiety, will decrease zoloft to 25 mg daily

## 2014-03-05 ENCOUNTER — Non-Acute Institutional Stay (SKILLED_NURSING_FACILITY): Payer: Medicare Other | Admitting: Internal Medicine

## 2014-03-05 ENCOUNTER — Encounter: Payer: Self-pay | Admitting: Internal Medicine

## 2014-03-05 DIAGNOSIS — I10 Essential (primary) hypertension: Secondary | ICD-10-CM

## 2014-03-05 NOTE — Progress Notes (Signed)
MRN: [id number] Name: Sheryl Jones  Sex: female Age: 80 y.o. DOB: September 17, 1934  [facility]: [facility] Facility/Room: 302A Level Of Care: SNF Provider: Margit HanksAnne D Keala Drum Emergency Contacts: Extended Emergency Contact Information Primary Emergency Contact: Milana KidneySmith,Jerusha  [place] Home Phone: [phone] Relation: Daughter Secondary Emergency Contact: Legrand PittsMcCall,Veronica          [place], [place] Home Phone: [phone] Relation: Other  Code Status: DNR  Allergies: Review of patient's allergies indicates no known allergies.  Chief Complaint  Patient presents with  . Medical Management of Chronic Issues    HPI: Patient is 80 y.o. female who is being seen because family has concern for her BP. i am to meet family this am at 11am to discuss.  Past Medical History  Diagnosis Date  . Dementia   . Hypertension   . Diabetes mellitus   . Iron deficiency anemia   . Hypokalemia   . Depressive disorder   . Anxiety   . Unspecified epilepsy without mention of intractable epilepsy   . Other and unspecified hyperlipidemia   . Colon polyp   . Iron deficiency anemia 10/2009    Past Surgical History  Procedure Laterality Date  . Esophagogastroduodenoscopy N/A 10/31/2013    Procedure: ESOPHAGOGASTRODUODENOSCOPY (EGD);  Surgeon: Rachael Feeaniel P Jacobs, MD;  [facility];  Service: Endoscopy;  Laterality: N/A;      Medication List       This list is accurate as of: 03/05/14  4:14 PM.  Always use your most recent med list.               acetaminophen 500 MG tablet  Commonly known as:  TYLENOL  Take 1,000 mg by mouth every 8 (eight) hours as needed for mild pain.     aspirin 81 MG chewable tablet  Chew 81 mg by mouth daily.     BENGAY EX  Apply 1 application topically 2 (two) times daily as needed. For knee pain     divalproex 500 MG DR tablet  Commonly known as:  DEPAKOTE  Take 500 mg by mouth 2 (two) times daily.     feeding supplement (ENSURE COMPLETE) Liqd  Take  237 mLs by mouth 2 (two) times daily between meals.     feeding supplement (ENSURE) Pudg  Take 1 Container by mouth 2 (two) times daily between meals.     ferrous fumarate 325 (106 FE) MG Tabs tablet  Commonly known as:  HEMOCYTE - 106 mg FE  Take 1 tablet by mouth 2 (two) times daily.     lisinopril 10 MG tablet  Commonly known as:  PRINIVIL,ZESTRIL  Take 10 mg by mouth daily.     oxyCODONE 5 MG immediate release tablet  Commonly known as:  ROXICODONE  Take one tablet by mouth every 6 hours as needed for pain     rivaroxaban 20 MG Tabs tablet  Commonly known as:  XARELTO  Take 1 tablet (20 mg total) by mouth daily with supper.     senna-docusate 8.6-50 MG per tablet  Commonly known as:  SENOKOT S  Take 1 tablet by mouth at bedtime.     sertraline 25 MG tablet  Commonly known as:  ZOLOFT  Take 1 tablet (25 mg total) by mouth at bedtime.     Vitamin D-3 1000 UNITS Caps  Take 1 capsule by mouth daily.        No orders of the defined types were placed in this encounter.    Immunization History  Administered Date(s) Administered  . Influenza-Unspecified 07/23/2013  . PPD Test 08/30/2011  . Pneumococcal-Unspecified 08/09/2012  . Tdap 08/27/2011    History  Substance Use Topics  . Smoking status: Former Smoker    Types: Cigarettes  . Smokeless tobacco: Never Used  . Alcohol Use: No    Review of Systems - UTO from pt; nursing has no concerns  .   Filed Vitals:   03/05/14 1603  BP: 152/76  Pulse: 73  Temp: 98.1 F (36.7 C)  Resp: 20    Physical Exam  GENERAL APPEARANCE: Alert, nonconversant. Appropriately groomed. No acute distress;makes eye contact; this is baseline SKIN: No diaphoresis rash HEENT: Unremarkable RESPIRATORY: Breathing is even, unlabored. Lung sounds are clear   CARDIOVASCULAR: Heart RRR no murmurs, rubs or gallops. trace peripheral edema  GASTROINTESTINAL: Abdomen is soft, non-tender, not distended w/ normal bowel sounds.   GENITOURINARY: Bladder non tender, not distended  MUSCULOSKELETAL: No abnormal joints or musculature NEUROLOGIC: Cranial nerves 2-12 grossly intact PSYCHIATRIC:  no behavioral issues  Patient Active Problem List   Diagnosis Date Noted  . Anemia 02/03/2014  . Unspecified gastritis and gastroduodenitis without mention of hemorrhage 10/31/2013  . Leg edema, right 10/30/2013  . Syncope and collapse 10/30/2013  . Leg swelling 10/30/2013  . Protein-calorie malnutrition, severe 10/30/2013  . DVT, femoral, acute 10/30/2013  . Esophageal thickening 10/30/2013  . Nonspecific (abnormal) findings on radiological and other examination of gastrointestinal tract 10/30/2013  . Hypernatremia 09/10/2013  . UTI (urinary tract infection) 09/10/2013  . Unspecified constipation 01/21/2013  . Depression 01/20/2013  . Late effects of CVA (cerebrovascular accident) 01/20/2013  . Hypokalemia 08/30/2011  . Microcytic anemia 08/28/2011  . Syncope 08/27/2011  . Dementia without behavioral disturbance 08/27/2011  . HTN (hypertension) 08/27/2011  . Diabetes mellitus 08/27/2011  . Seizure disorder 08/27/2011    CBC    Component Value Date/Time   WBC 4.2 11/03/2013 0640   RBC 3.59* 11/03/2013 0640   RBC 4.25 11/05/2009 2050   HGB 8.7* 11/03/2013 0640   HCT 27.8* 11/03/2013 0640   PLT 336 11/03/2013 0640   MCV 77.4* 11/03/2013 0640   LYMPHSABS 2.2 10/29/2013 2020   MONOABS 0.6 10/29/2013 2020   EOSABS 0.2 10/29/2013 2020   BASOSABS 0.1 10/29/2013 2020    CMP     Component Value Date/Time   NA 145 11/03/2013 0640   K 4.2 11/03/2013 0640   CL 103 11/03/2013 0640   CO2 31 11/03/2013 0640   GLUCOSE 80 11/03/2013 0640   BUN 19 11/03/2013 0640   CREATININE 0.61 11/03/2013 0640   CALCIUM 8.8 11/03/2013 0640   PROT 7.0 01/20/2013 1459   ALBUMIN 3.6 01/20/2013 1459   AST 21 01/20/2013 1459   ALT 10 01/20/2013 1459   ALKPHOS 90 01/20/2013 1459   BILITOT 0.2* 01/20/2013 1459   GFRNONAA 85* 11/03/2013 0640   GFRAA >90  11/03/2013 0640    Assessment and Plan  HTN (hypertension) Pt's family, daughter, I think , was concerned because one day when they were with pt pt's BP was elevated.I first reviewed pt with Vondra over the phone yesterday. We reviewed a list of BP's. Today I reviewed a longer list of BP's in preparation for meeting with pt's family. However, they did not show up. Over a 13 day period, pt's lowest BP was 110/58. Pt's highest BP was 180/75, the second highest 162/64.As before the majority of BP's were in the 130's and 140's.Increasing pt's Lisinopril would be too much so the only  other option would be to add a medication. Normally I would choose Norvasc  Because its q d but pt has h/o chronic pedal edema so not a good choice. Agree with Shanda Bumpsjessica, would opt to keep same regimen.   Time spent on phone with Seward MethVondra, reviewing chart and BP record > 35 min Margit HanksAnne D Ferdie Bakken, MD

## 2014-03-05 NOTE — Assessment & Plan Note (Signed)
Pt's family, daughter, I think , was concerned because one day when they were with pt pt's BP was elevated.I first reviewed pt with Vondra over the phone yesterday. We reviewed a list of BP's. Today I reviewed a longer list of BP's in preparation for meeting with pt's family. However, they did not show up. Over a 13 day period, pt's lowest BP was 110/58. Pt's highest BP was 180/75, the second highest 162/64.As before the majority of BP's were in the 130's and 140's.Increasing pt's Lisinopril would be too much so the only other option would be to add a medication. Normally I would choose Norvasc  Because its q d but pt has h/o chronic pedal edema so not a good choice. Agree with Shanda Bumpsjessica, would opt to keep same regimen.

## 2014-06-01 ENCOUNTER — Non-Acute Institutional Stay (SKILLED_NURSING_FACILITY): Payer: PRIVATE HEALTH INSURANCE | Admitting: Internal Medicine

## 2014-06-01 ENCOUNTER — Encounter: Payer: Self-pay | Admitting: Internal Medicine

## 2014-06-01 DIAGNOSIS — G40909 Epilepsy, unspecified, not intractable, without status epilepticus: Secondary | ICD-10-CM

## 2014-06-01 DIAGNOSIS — F329 Major depressive disorder, single episode, unspecified: Secondary | ICD-10-CM

## 2014-06-01 DIAGNOSIS — F0390 Unspecified dementia without behavioral disturbance: Secondary | ICD-10-CM

## 2014-06-01 DIAGNOSIS — F3289 Other specified depressive episodes: Secondary | ICD-10-CM

## 2014-06-01 DIAGNOSIS — F32A Depression, unspecified: Secondary | ICD-10-CM

## 2014-06-01 DIAGNOSIS — D509 Iron deficiency anemia, unspecified: Secondary | ICD-10-CM

## 2014-06-01 DIAGNOSIS — I10 Essential (primary) hypertension: Secondary | ICD-10-CM

## 2014-06-01 NOTE — Assessment & Plan Note (Signed)
Depakote 500mg  BID and last 2 levels low-35 in July, 31 in may; will increase to 500 mg q am and 750 mg q pm. New level in 1 month

## 2014-06-01 NOTE — Assessment & Plan Note (Addendum)
2/2 dementia and aphasia hard to assess but their best assessment was pt improved and d/c sertraline

## 2014-06-01 NOTE — Assessment & Plan Note (Signed)
Last HbA1c was 11/2013   Was 4.6  Will d/c lisinopril completely as pt not in DM range

## 2014-06-01 NOTE — Assessment & Plan Note (Addendum)
BP readings for last 5 days-181/75, 179/85, 194/78, 195/83, 186/78.  SBP the 4 days before that were 116, 110, 124, 121. This has been an issue in the past. Last BUN/CR 01/2014 was 18/0.67. Pt is AA. Hba1c was 4.6 so we can d/c lisinopril totally. Start Norvasc 10 mg daily. If still high in 2-3 weeks will add HCTZ.

## 2014-06-01 NOTE — Assessment & Plan Note (Addendum)
04/2014  10.0/30.8  PLT 276 up from prior in may of 9.5; continue iron

## 2014-06-01 NOTE — Progress Notes (Signed)
MRN: [id number] Name: Sheryl Jones  Sex: female Age: 80 y.o. DOB: August 02, 1934  [facility] #: [facility] Facility/Room: 302a Level Of Care: SNF Provider: Merrilee Seashore D Emergency Contacts: Extended Emergency Contact Information Primary Emergency Contact: Jolene Provost of [place] Home Phone: [phone] Relation: Daughter Secondary Emergency Contact: Laini, Urick, [place] Home Phone: [phone] Relation: Other  Code Status: DNR   Allergies: Review of patient's allergies indicates no known allergies.  Chief Complaint  Patient presents with  . Medical Management of Chronic Issues    HPI: Patient is 80 y.o. female who is being seen for routine issues.  Past Medical History  Diagnosis Date  . Dementia   . Hypertension   . Diabetes mellitus   . Iron deficiency anemia   . Hypokalemia   . Depressive disorder   . Anxiety   . Unspecified epilepsy without mention of intractable epilepsy   . Other and unspecified hyperlipidemia   . Colon polyp   . Iron deficiency anemia 10/2009    Past Surgical History  Procedure Laterality Date  . Esophagogastroduodenoscopy N/A 10/31/2013    Procedure: ESOPHAGOGASTRODUODENOSCOPY (EGD);  Surgeon: Rachael Fee, MD;  Location: [facility] ENDOSCOPY;  Service: Endoscopy;  Laterality: N/A;      Medication List       This list is accurate as of: 06/01/14 11:59 PM.  Always use your most recent med list.               acetaminophen 500 MG tablet  Commonly known as:  TYLENOL  Take 1,000 mg by mouth every 8 (eight) hours as needed for mild pain.     aspirin 81 MG chewable tablet  Chew 81 mg by mouth daily.     BENGAY EX  Apply 1 application topically 2 (two) times daily as needed. For knee pain     divalproex 500 MG DR tablet  Commonly known as:  DEPAKOTE  Take 500 mg by mouth 2 (two) times daily.     feeding supplement (ENSURE COMPLETE) Liqd  Take 237 mLs by mouth 2 (two) times daily between meals.      feeding supplement (ENSURE) Pudg  Take 1 Container by mouth 2 (two) times daily between meals.     ferrous fumarate 325 (106 FE) MG Tabs tablet  Commonly known as:  HEMOCYTE - 106 mg FE  Take 1 tablet by mouth 2 (two) times daily.     lisinopril 10 MG tablet  Commonly known as:  PRINIVIL,ZESTRIL  Take 10 mg by mouth daily.     oxyCODONE 5 MG immediate release tablet  Commonly known as:  ROXICODONE  Take one tablet by mouth every 6 hours as needed for pain     rivaroxaban 20 MG Tabs tablet  Commonly known as:  XARELTO  Take 1 tablet (20 mg total) by mouth daily with supper.     senna-docusate 8.6-50 MG per tablet  Commonly known as:  SENOKOT S  Take 1 tablet by mouth at bedtime.     sertraline 25 MG tablet  Commonly known as:  ZOLOFT  Take 1 tablet (25 mg total) by mouth at bedtime.     Vitamin D-3 1000 UNITS Caps  Take 1 capsule by mouth daily.        No orders of the defined types were placed in this encounter.    Immunization History  Administered Date(s) Administered  . Influenza-Unspecified 07/23/2013  . PPD Test 08/30/2011  .  Pneumococcal-Unspecified 08/09/2012  . Tdap 08/27/2011    History  Substance Use Topics  . Smoking status: Former Smoker    Types: Cigarettes  . Smokeless tobacco: Never Used  . Alcohol Use: No    Review of Systems  DATA OBTAINED: from patient GENERAL: Feels well no fevers, fatigue, appetite changes SKIN: No itching, rash HEENT: No complaint RESPIRATORY: No cough, wheezing, SOB CARDIAC: No chest pain, palpitations, lower extremity edema  GI: No abdominal pain, No N/V/D or constipation, No heartburn or reflux  GU: No dysuria, frequency or urgency, or incontinence  MUSCULOSKELETAL: No unrelieved bone/joint pain NEUROLOGIC: No headache, dizziness PSYCHIATRIC: No overt anxiety; Sleeps well.   Filed Vitals:   06/01/14 1319  BP: 137/77  Pulse: 76  Temp: 97.1 F (36.2 C)  Resp: 19    Physical Exam  GENERAL APPEARANCE:  Alert,non conversant. Appropriately groomed. No acute distress  SKIN: No diaphoresis rash HEENT: Unremarkable RESPIRATORY: Breathing is even, unlabored. Lung sounds are clear bur diffusely decreased   CARDIOVASCULAR: Heart RRR no murmurs, rubs or gallops. 1+ peripheral edema  GASTROINTESTINAL: Abdomen is soft, non-tender, not distended w/ normal bowel sounds.  GENITOURINARY: Bladder non tender, not distended  MUSCULOSKELETAL: No abnormal joints or musculature NEUROLOGIC: Cranial nerves 2-12 grossly intact; functional quadriplegia PSYCHIATRIC: Mood and affect appropriate to situation, no behavioral issues  Patient Active Problem List   Diagnosis Date Noted  . Anemia 02/03/2014  . Unspecified gastritis and gastroduodenitis without mention of hemorrhage 10/31/2013  . Leg edema, right 10/30/2013  . Syncope and collapse 10/30/2013  . Leg swelling 10/30/2013  . Protein-calorie malnutrition, severe 10/30/2013  . DVT, femoral, acute 10/30/2013  . Esophageal thickening 10/30/2013  . Nonspecific (abnormal) findings on radiological and other examination of gastrointestinal tract 10/30/2013  . Hypernatremia 09/10/2013  . UTI (urinary tract infection) 09/10/2013  . Unspecified constipation 01/21/2013  . Depression 01/20/2013  . Late effects of CVA (cerebrovascular accident) 01/20/2013  . Hypokalemia 08/30/2011  . Microcytic anemia 08/28/2011  . Syncope 08/27/2011  . Dementia without behavioral disturbance 08/27/2011  . HTN (hypertension) 08/27/2011  . Type II or unspecified type diabetes mellitus without mention of complication, uncontrolled 08/27/2011  . Seizure disorder 08/27/2011    CBC    Component Value Date/Time   WBC 4.2 11/03/2013 0640   RBC 3.59* 11/03/2013 0640   RBC 4.25 11/05/2009 2050   HGB 8.7* 11/03/2013 0640   HCT 27.8* 11/03/2013 0640   PLT 336 11/03/2013 0640   MCV 77.4* 11/03/2013 0640   LYMPHSABS 2.2 10/29/2013 2020   MONOABS 0.6 10/29/2013 2020   EOSABS 0.2 10/29/2013 2020    BASOSABS 0.1 10/29/2013 2020    CMP     Component Value Date/Time   NA 145 11/03/2013 0640   K 4.2 11/03/2013 0640   CL 103 11/03/2013 0640   CO2 31 11/03/2013 0640   GLUCOSE 80 11/03/2013 0640   BUN 19 11/03/2013 0640   CREATININE 0.61 11/03/2013 0640   CALCIUM 8.8 11/03/2013 0640   PROT 7.0 01/20/2013 1459   ALBUMIN 3.6 01/20/2013 1459   AST 21 01/20/2013 1459   ALT 10 01/20/2013 1459   ALKPHOS 90 01/20/2013 1459   BILITOT 0.2* 01/20/2013 1459   GFRNONAA 85* 11/03/2013 0640   GFRAA >90 11/03/2013 0640    Assessment and Plan  HTN (hypertension) BP readings for last 5 days-181/75, 179/85, 194/78, 195/83, 186/78.  SBP the 4 days before that were 116, 110, 124, 121. This has been an issue in the past. Last BUN/CR  01/2014 was 18/0.67. Pt is AA. Hba1c was 4.6 so we can d/c lisinopril totally. Start Norvasc 10 mg daily. If still high in 2-3 weeks will add HCTZ.  Type II or unspecified type diabetes mellitus without mention of complication, uncontrolled Last HbA1c was 11/2013   Was 4.6  Will d/c lisinopril completely as pt not in DM range  Dementia without behavioral disturbance Stable on zoloft 25  Seizure disorder Depakote 500mg  BID and last 2 levels low-35 in July, 31 in may; will increase to 500 mg q am and 750 mg q pm. New level in 1 month  Anemia 04/2014  10.0/30.8  PLT 276 up from prior in may of 9.5; continue iron  Depression  2/2 dementia and aphasia hard to assess but their best assessment was pt improved and d/c sertraline    Margit HanksALEXANDER, Sultana Tierney D, MD

## 2014-06-01 NOTE — Assessment & Plan Note (Signed)
Stable on zoloft 25

## 2014-06-11 ENCOUNTER — Encounter: Payer: Self-pay | Admitting: Internal Medicine

## 2014-08-10 ENCOUNTER — Non-Acute Institutional Stay (SKILLED_NURSING_FACILITY): Payer: PRIVATE HEALTH INSURANCE | Admitting: Internal Medicine

## 2014-08-10 DIAGNOSIS — I6990 Unspecified sequelae of unspecified cerebrovascular disease: Secondary | ICD-10-CM

## 2014-08-10 DIAGNOSIS — D509 Iron deficiency anemia, unspecified: Secondary | ICD-10-CM

## 2014-08-10 DIAGNOSIS — E876 Hypokalemia: Secondary | ICD-10-CM

## 2014-08-10 DIAGNOSIS — I10 Essential (primary) hypertension: Secondary | ICD-10-CM

## 2014-08-10 DIAGNOSIS — E870 Hyperosmolality and hypernatremia: Secondary | ICD-10-CM

## 2014-08-10 DIAGNOSIS — G40909 Epilepsy, unspecified, not intractable, without status epilepticus: Secondary | ICD-10-CM

## 2014-08-10 NOTE — Progress Notes (Signed)
MRN: [id number] Name: Sheryl DuffMary Stroschein  Sex: female Age: 80 y.o. DOB: 13-Nov-1933  [facility] Facility/Room: [facility]A Level Of Care: SNF Provider: Merrilee SeashoreALEXANDER, Sanai Frick D Emergency Contacts: Extended Emergency Contact Information Primary Emergency Contact: Jolene ProvostSmith,Jerusha  [place] Home Phone: [phone] Relation: Daughter Secondary Emergency Contact: Legrand PittsMcCall,Veronica          [place], [place] Home Phone: [phone] Relation: Other  Code Status: DNR  Allergies: Review of patient's allergies indicates no known allergies.  Chief Complaint  Patient presents with  . Medical Management of Chronic Issues    HPI: Patient is 80 y.o. female who is being seen for routine issues.  Past Medical History  Diagnosis Date  . Dementia   . Hypertension   . Diabetes mellitus   . Iron deficiency anemia   . Hypokalemia   . Depressive disorder   . Anxiety   . Unspecified epilepsy without mention of intractable epilepsy   . Other and unspecified hyperlipidemia   . Colon polyp   . Iron deficiency anemia 10/2009    Past Surgical History  Procedure Laterality Date  . Esophagogastroduodenoscopy N/A 10/31/2013    Procedure: ESOPHAGOGASTRODUODENOSCOPY (EGD);  Surgeon: Rachael Feeaniel P Jacobs, MD;  [facility];  Service: Endoscopy;  Laterality: N/A;      Medication List       This list is accurate as of: 08/10/14 11:59 PM.  Always use your most recent med list.               acetaminophen 500 MG tablet  Commonly known as:  TYLENOL  Take 1,000 mg by mouth every 8 (eight) hours as needed for mild pain.     amLODipine 10 MG tablet  Commonly known as:  NORVASC  Take 10 mg by mouth daily.     aspirin 81 MG chewable tablet  Chew 81 mg by mouth daily.     BENGAY EX  Apply 1 application topically 2 (two) times daily as needed. For knee pain     divalproex 500 MG DR tablet  Commonly known as:  DEPAKOTE  Take 500 mg by mouth 2 (two) times daily.     feeding supplement  (ENSURE COMPLETE) Liqd  Take 237 mLs by mouth 2 (two) times daily between meals.     feeding supplement (ENSURE) Pudg  Take 1 Container by mouth 2 (two) times daily between meals.     ferrous fumarate 325 (106 FE) MG Tabs tablet  Commonly known as:  HEMOCYTE - 106 mg FE  Take 1 tablet by mouth 2 (two) times daily.     oxyCODONE 5 MG immediate release tablet  Commonly known as:  ROXICODONE  Take one tablet by mouth every 6 hours as needed for pain     rivaroxaban 20 MG Tabs tablet  Commonly known as:  XARELTO  Take 1 tablet (20 mg total) by mouth daily with supper.     senna-docusate 8.6-50 MG per tablet  Commonly known as:  SENOKOT S  Take 1 tablet by mouth at bedtime.     Vitamin D-3 1000 UNITS Caps  Take 1 capsule by mouth daily.        Meds ordered this encounter  Medications  . amLODipine (NORVASC) 10 MG tablet    Sig: Take 10 mg by mouth daily.    Immunization History  Administered Date(s) Administered  . Influenza-Unspecified 07/23/2013  . PPD Test 08/30/2011  . Pneumococcal-Unspecified 08/09/2012  . Tdap 08/27/2011    History  Substance Use Topics  .  Smoking status: Former Smoker    Types: Cigarettes  . Smokeless tobacco: Never Used  . Alcohol Use: No    Review of Systems:    UTO 2/2 pt condition;nursing voice no concerns  Filed Vitals:   08/10/14 1210  BP: 156/100  Pulse: 82  Temp: 98.4 F (36.9 C)  Resp: 18    Physical Exam  GENERAL APPEARANCE: Alert, nonconversant, No acute distress  SKIN: No diaphoresis rash HEENT: Unremarkable RESPIRATORY: Breathing is even, unlabored. Lung sounds are clear   CARDIOVASCULAR: Heart RRR no murmurs, rubs or gallops. No peripheral edema  GASTROINTESTINAL: Abdomen is soft, non-tender, not distended w/ normal bowel sounds.  GENITOURINARY: Bladder non tender, not distended  MUSCULOSKELETAL: No abnormal joints or musculature NEUROLOGIC: Cranial nerves 2-12 grossly intact; brace LUE PSYCHIATRIC:  no  behavioral issues  Patient Active Problem List   Diagnosis Date Noted  . Anemia, iron deficiency 02/03/2014  . Unspecified gastritis and gastroduodenitis without mention of hemorrhage 10/31/2013  . Leg edema, right 10/30/2013  . Syncope and collapse 10/30/2013  . Leg swelling 10/30/2013  . Protein-calorie malnutrition, severe 10/30/2013  . DVT, femoral, acute 10/30/2013  . Esophageal thickening 10/30/2013  . Nonspecific (abnormal) findings on radiological and other examination of gastrointestinal tract 10/30/2013  . Hypernatremia 09/10/2013  . UTI (urinary tract infection) 09/10/2013  . Unspecified constipation 01/21/2013  . Depression 01/20/2013  . Late effects of CVA (cerebrovascular accident) 01/20/2013  . Hypokalemia 08/30/2011  . Microcytic anemia 08/28/2011  . Syncope 08/27/2011  . Dementia without behavioral disturbance 08/27/2011  . HTN (hypertension) 08/27/2011  . Type II or unspecified type diabetes mellitus without mention of complication, uncontrolled 08/27/2011  . Seizure disorder 08/27/2011    CBC    Component Value Date/Time   WBC 4.2 11/03/2013 0640   RBC 3.59* 11/03/2013 0640   RBC 4.25 11/05/2009 2050   HGB 8.7* 11/03/2013 0640   HCT 27.8* 11/03/2013 0640   PLT 336 11/03/2013 0640   MCV 77.4* 11/03/2013 0640   LYMPHSABS 2.2 10/29/2013 2020   MONOABS 0.6 10/29/2013 2020   EOSABS 0.2 10/29/2013 2020   BASOSABS 0.1 10/29/2013 2020    CMP     Component Value Date/Time   NA 145 11/03/2013 0640   K 4.2 11/03/2013 0640   CL 103 11/03/2013 0640   CO2 31 11/03/2013 0640   GLUCOSE 80 11/03/2013 0640   BUN 19 11/03/2013 0640   CREATININE 0.61 11/03/2013 0640   CALCIUM 8.8 11/03/2013 0640   PROT 7.0 01/20/2013 1459   ALBUMIN 3.6 01/20/2013 1459   AST 21 01/20/2013 1459   ALT 10 01/20/2013 1459   ALKPHOS 90 01/20/2013 1459   BILITOT 0.2* 01/20/2013 1459   GFRNONAA 85* 11/03/2013 0640   GFRAA >90 11/03/2013 0640    Assessment and Plan  HTN (hypertension) HCTZ 12.5 added in  06/2014; will plan to inc to 25 mg for still uncontrolled after making [facility] aware (COO)  Late effects of CVA (cerebrovascular accident) No change in functional quadriplegia and minimal responsiveness; splint on LUE to decrease/prevent contractures  Anemia, iron deficiency 10.5/31.4 in 06/2014, improved over prior; on iron  Seizure disorder Level in 06/2014 59.4 which is improved and theraputic. Continue current dosage  DVT, femoral, acute Pt continue on xarelto, its been > 6 months; consider d/c  Hypernatremia No recent abn-Na in 06/2014 is 143  Hypokalemia No abnormalities -K+ 06/2014 3.7    Margit HanksALEXANDER, Tekisha Darcey D, MD

## 2014-08-17 ENCOUNTER — Non-Acute Institutional Stay (SKILLED_NURSING_FACILITY): Payer: PRIVATE HEALTH INSURANCE | Admitting: Internal Medicine

## 2014-08-17 DIAGNOSIS — F0390 Unspecified dementia without behavioral disturbance: Secondary | ICD-10-CM

## 2014-08-17 DIAGNOSIS — Y95 Nosocomial condition: Principal | ICD-10-CM

## 2014-08-17 DIAGNOSIS — E870 Hyperosmolality and hypernatremia: Secondary | ICD-10-CM

## 2014-08-17 DIAGNOSIS — J189 Pneumonia, unspecified organism: Secondary | ICD-10-CM

## 2014-08-17 NOTE — Progress Notes (Signed)
MRN: [id number] Name: Sheryl Jones  Sex: female Age: 80 y.o. DOB: December 03, 1933  [facility] Facility/Room: [facility]A Level Of Care: SNF Provider: Merrilee SeashoreALEXANDER, ANNE D Emergency Contacts: Extended Emergency Contact Information Primary Emergency Contact: Jolene ProvostSmith,Jerusha  [place] Home Phone: [phone] Relation: Daughter Secondary Emergency Contact: Legrand PittsMcCall,Veronica          [place], [place] Home Phone: [phone] Relation: Other  Code Status: DNR  Allergies: Review of patient's allergies indicates no known allergies.  Chief Complaint  Patient presents with  . Acute Visit    HPI: Patient is 80 y.o. female who is [facility] pt who was dx with PNA today.  Past Medical History  Diagnosis Date  . Dementia   . Hypertension   . Diabetes mellitus   . Iron deficiency anemia   . Hypokalemia   . Depressive disorder   . Anxiety   . Unspecified epilepsy without mention of intractable epilepsy   . Other and unspecified hyperlipidemia   . Colon polyp   . Iron deficiency anemia 10/2009    Past Surgical History  Procedure Laterality Date  . Esophagogastroduodenoscopy N/A 10/31/2013    Procedure: ESOPHAGOGASTRODUODENOSCOPY (EGD);  Surgeon: Rachael Feeaniel P Jacobs, MD;  [facility];  Service: Endoscopy;  Laterality: N/A;      Medication List       This list is accurate as of: 08/17/14 11:59 PM.  Always use your most recent med list.               acetaminophen 500 MG tablet  Commonly known as:  TYLENOL  Take 1,000 mg by mouth every 8 (eight) hours as needed for mild pain.     amLODipine 10 MG tablet  Commonly known as:  NORVASC  Take 10 mg by mouth daily.     aspirin 81 MG chewable tablet  Chew 81 mg by mouth daily.     BENGAY EX  Apply 1 application topically 2 (two) times daily as needed. For knee pain     divalproex 500 MG DR tablet  Commonly known as:  DEPAKOTE  Take 500 mg by mouth 2 (two) times daily.     feeding supplement (ENSURE COMPLETE) Liqd   Take 237 mLs by mouth 2 (two) times daily between meals.     feeding supplement (ENSURE) Pudg  Take 1 Container by mouth 2 (two) times daily between meals.     ferrous fumarate 325 (106 FE) MG Tabs tablet  Commonly known as:  HEMOCYTE - 106 mg FE  Take 1 tablet by mouth 2 (two) times daily.     oxyCODONE 5 MG immediate release tablet  Commonly known as:  ROXICODONE  Take one tablet by mouth every 6 hours as needed for pain     rivaroxaban 20 MG Tabs tablet  Commonly known as:  XARELTO  Take 1 tablet (20 mg total) by mouth daily with supper.     senna-docusate 8.6-50 MG per tablet  Commonly known as:  SENOKOT S  Take 1 tablet by mouth at bedtime.     Vitamin D-3 1000 UNITS Caps  Take 1 capsule by mouth daily.        No orders of the defined types were placed in this encounter.    Immunization History  Administered Date(s) Administered  . Influenza-Unspecified 07/23/2013  . PPD Test 08/30/2011  . Pneumococcal-Unspecified 08/09/2012  . Tdap 08/27/2011    History  Substance Use Topics  . Smoking status: Former Smoker    Types: Cigarettes  .  Smokeless tobacco: Never Used  . Alcohol Use: No    Review of Systems   Obtained from nursing    Filed Vitals:   08/17/14 2207  BP: 96/48  Pulse: 108  Temp: 99 F (37.2 C)  Resp: 22    Physical Exam  GENERAL APPEARANCE: mod obtunded, nonconversant, No acute distress  SKIN: No diaphoresis rash HEENT: Unremarkable RESPIRATORY: Breathing is even, mild labored. Lung sounds are decreased on Left  CARDIOVASCULAR: Heart reg, inc rate,  no murmurs, rubs or gallops. No peripheral edema  GASTROINTESTINAL: Abdomen is soft, non-tender, not distended w/ normal bowel sounds.  GENITOURINARY: Bladder non tender, not distended  MUSCULOSKELETAL: No abnormal joints or musculature NEUROLOGIC: Cranial nerves 2-12 grossly intact. PSYCHIATRIC:  no behavioral issues  Patient Active Problem List   Diagnosis Date Noted  . HAP  (hospital-acquired pneumonia) 08/19/2014  . Anemia, iron deficiency 02/03/2014  . Unspecified gastritis and gastroduodenitis without mention of hemorrhage 10/31/2013  . Leg edema, right 10/30/2013  . Syncope and collapse 10/30/2013  . Leg swelling 10/30/2013  . Protein-calorie malnutrition, severe 10/30/2013  . DVT, femoral, acute 10/30/2013  . Esophageal thickening 10/30/2013  . Nonspecific (abnormal) findings on radiological and other examination of gastrointestinal tract 10/30/2013  . Hypernatremia 09/10/2013  . UTI (urinary tract infection) 09/10/2013  . Unspecified constipation 01/21/2013  . Depression 01/20/2013  . Late effects of CVA (cerebrovascular accident) 01/20/2013  . Hypokalemia 08/30/2011  . Microcytic anemia 08/28/2011  . Syncope 08/27/2011  . Dementia without behavioral disturbance 08/27/2011  . HTN (hypertension) 08/27/2011  . Type II or unspecified type diabetes mellitus without mention of complication, uncontrolled 08/27/2011  . Seizure disorder 08/27/2011    CBC    Component Value Date/Time   WBC 4.2 11/03/2013 0640   RBC 3.59* 11/03/2013 0640   RBC 4.25 11/05/2009 2050   HGB 8.7* 11/03/2013 0640   HCT 27.8* 11/03/2013 0640   PLT 336 11/03/2013 0640   MCV 77.4* 11/03/2013 0640   LYMPHSABS 2.2 10/29/2013 2020   MONOABS 0.6 10/29/2013 2020   EOSABS 0.2 10/29/2013 2020   BASOSABS 0.1 10/29/2013 2020    CMP     Component Value Date/Time   NA 145 11/03/2013 0640   K 4.2 11/03/2013 0640   CL 103 11/03/2013 0640   CO2 31 11/03/2013 0640   GLUCOSE 80 11/03/2013 0640   BUN 19 11/03/2013 0640   CREATININE 0.61 11/03/2013 0640   CALCIUM 8.8 11/03/2013 0640   PROT 7.0 01/20/2013 1459   ALBUMIN 3.6 01/20/2013 1459   AST 21 01/20/2013 1459   ALT 10 01/20/2013 1459   ALKPHOS 90 01/20/2013 1459   BILITOT 0.2* 01/20/2013 1459   GFRNONAA 85* 11/03/2013 0640   GFRAA >90 11/03/2013 0640    Assessment and Plan  HAP (hospital-acquired pneumonia) Pt has had a MS change with decreased  activity and po intake and w/u revealed LLL PNA on CXR. WBC is 8.4 but VS c/w PNA. Pt will receive rocepin IM daily for 7 days, IVF and O2. Roxanol has been written. Pt has made it clear prior she does not want to go to hospital and she is totally unafraid to die. This could be her last illness.  Hypernatremia After having no problems with Na+ for quite a while pt's BMP 10/26 with Na+ 158, BUN 55/Cr 1.15 due to decrease po intake and fluid loss through lungs. IVF with 1/2 NS is being run.  Dementia without behavioral disturbance Pt has dementia but not severe and she understands  important things like not wanting to be hospitalized and meaning of DNR. She appears comfortable.    Margit Hanks, MD

## 2014-08-19 ENCOUNTER — Encounter: Payer: Self-pay | Admitting: Internal Medicine

## 2014-08-19 DIAGNOSIS — J189 Pneumonia, unspecified organism: Secondary | ICD-10-CM | POA: Insufficient documentation

## 2014-08-19 DIAGNOSIS — Y95 Nosocomial condition: Principal | ICD-10-CM

## 2014-08-19 NOTE — Assessment & Plan Note (Signed)
Level in 06/2014 59.4 which is improved and theraputic. Continue current dosage

## 2014-08-19 NOTE — Assessment & Plan Note (Signed)
No recent abn-Na in 06/2014 is 143

## 2014-08-19 NOTE — Assessment & Plan Note (Addendum)
DNR. She appears comfortable.

## 2014-08-19 NOTE — Assessment & Plan Note (Signed)
No abnormalities -K+ 06/2014 3.7

## 2014-08-19 NOTE — Assessment & Plan Note (Addendum)
Pt has had a MS change with decreased activity and po intake and w/u revealed LLL PNA on CXR. WBC is 8.4 but VS bordering unstable and c/w PNA. Pt will receive rocepin IM daily for 7 days, IVF and O2. Roxanol has been written. This could be her last illness.

## 2014-08-19 NOTE — Assessment & Plan Note (Signed)
Pt continue on xarelto, its been > 6 months; consider d/c

## 2014-08-19 NOTE — Assessment & Plan Note (Signed)
After having no problems with Na+ for quite a while pt's BMP 10/26 with Na+ 158, BUN 55/Cr 1.15 due to decrease po intake and fluid loss through lungs. IVF with 1/2 NS is being run.

## 2014-08-19 NOTE — Assessment & Plan Note (Signed)
No change in functional quadriplegia and minimal responsiveness; splint on LUE to decrease/prevent contractures

## 2014-08-19 NOTE — Assessment & Plan Note (Addendum)
10.5/31.4 in 06/2014, improved over prior; on iron

## 2014-08-19 NOTE — Assessment & Plan Note (Signed)
HCTZ 12.5 added in 06/2014; will plan to inc to 25 mg for still uncontrolled after making [facility] ([employer] aware (COO)

## 2014-08-23 DEATH — deceased

## 2015-10-29 IMAGING — CR DG HIP (WITH OR WITHOUT PELVIS) 2-3V*L*
3 series · 3 of 3 positions shown · non-contrast
Comparison: None.

CLINICAL DATA: Pain post trauma

EXAM:
LEFT HIP - COMPLETE 2+ VIEW

[t pelvis ap]
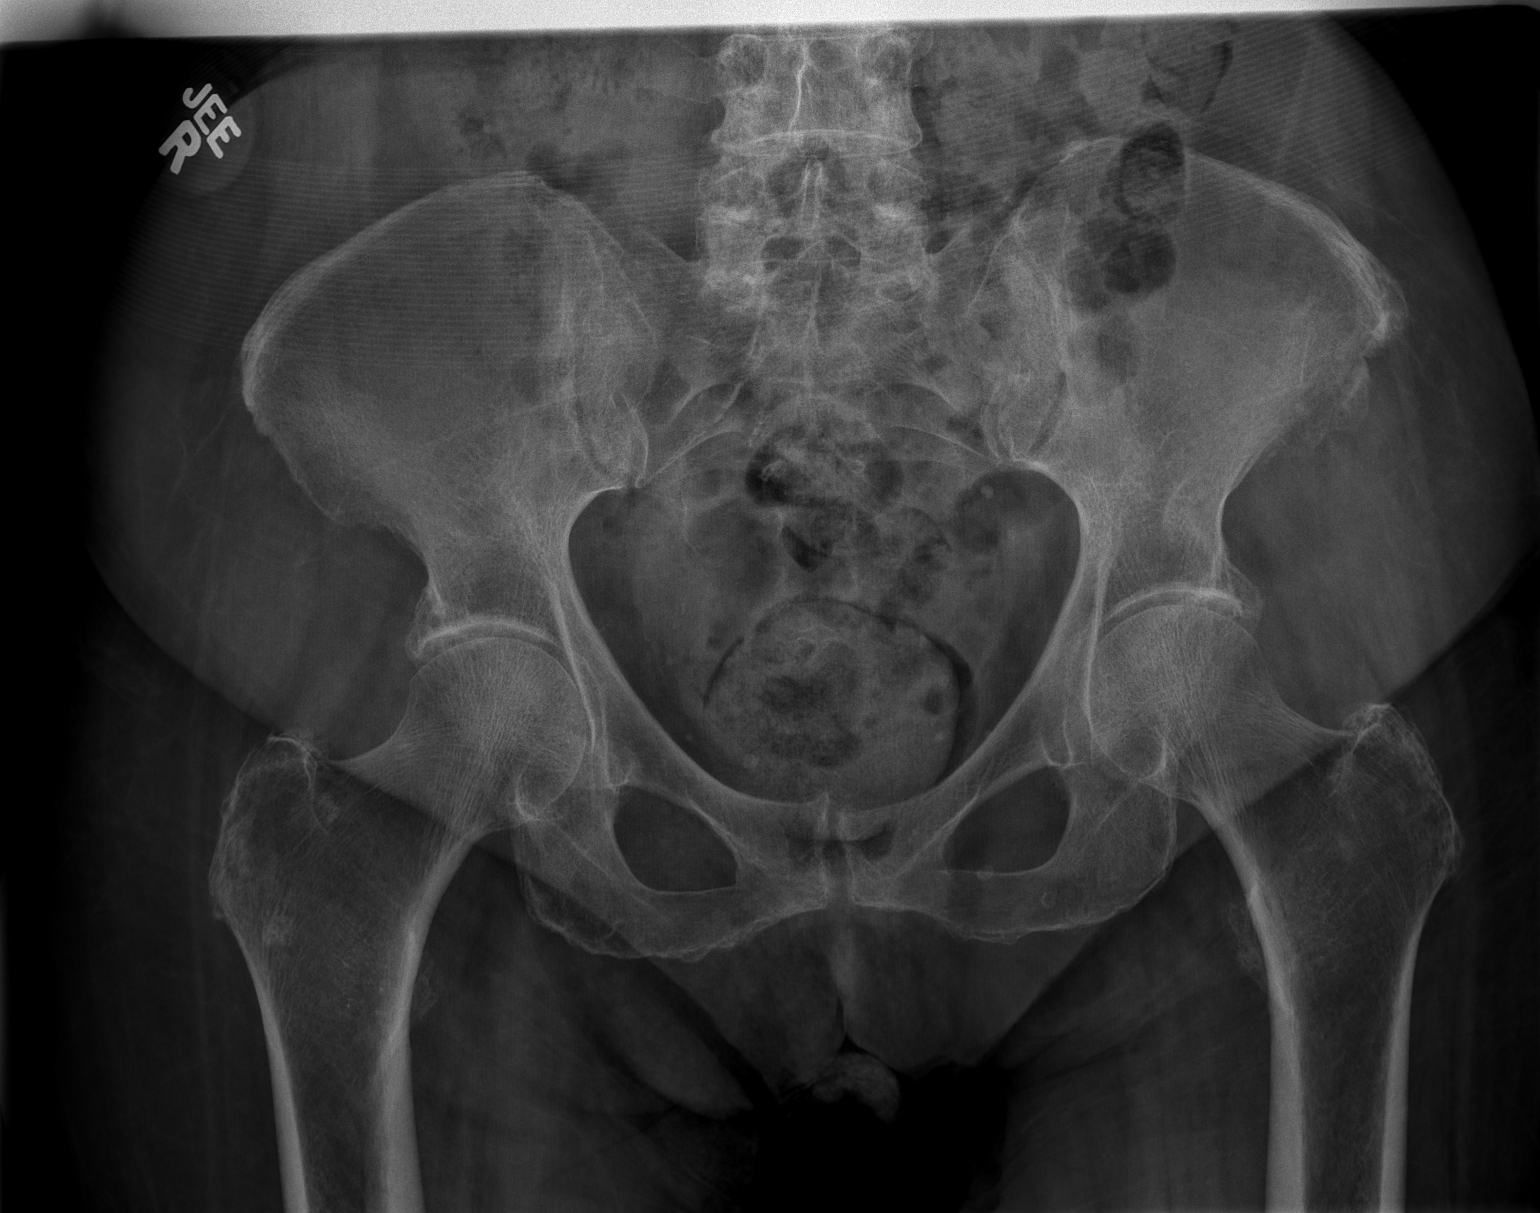

[t hip ap left]
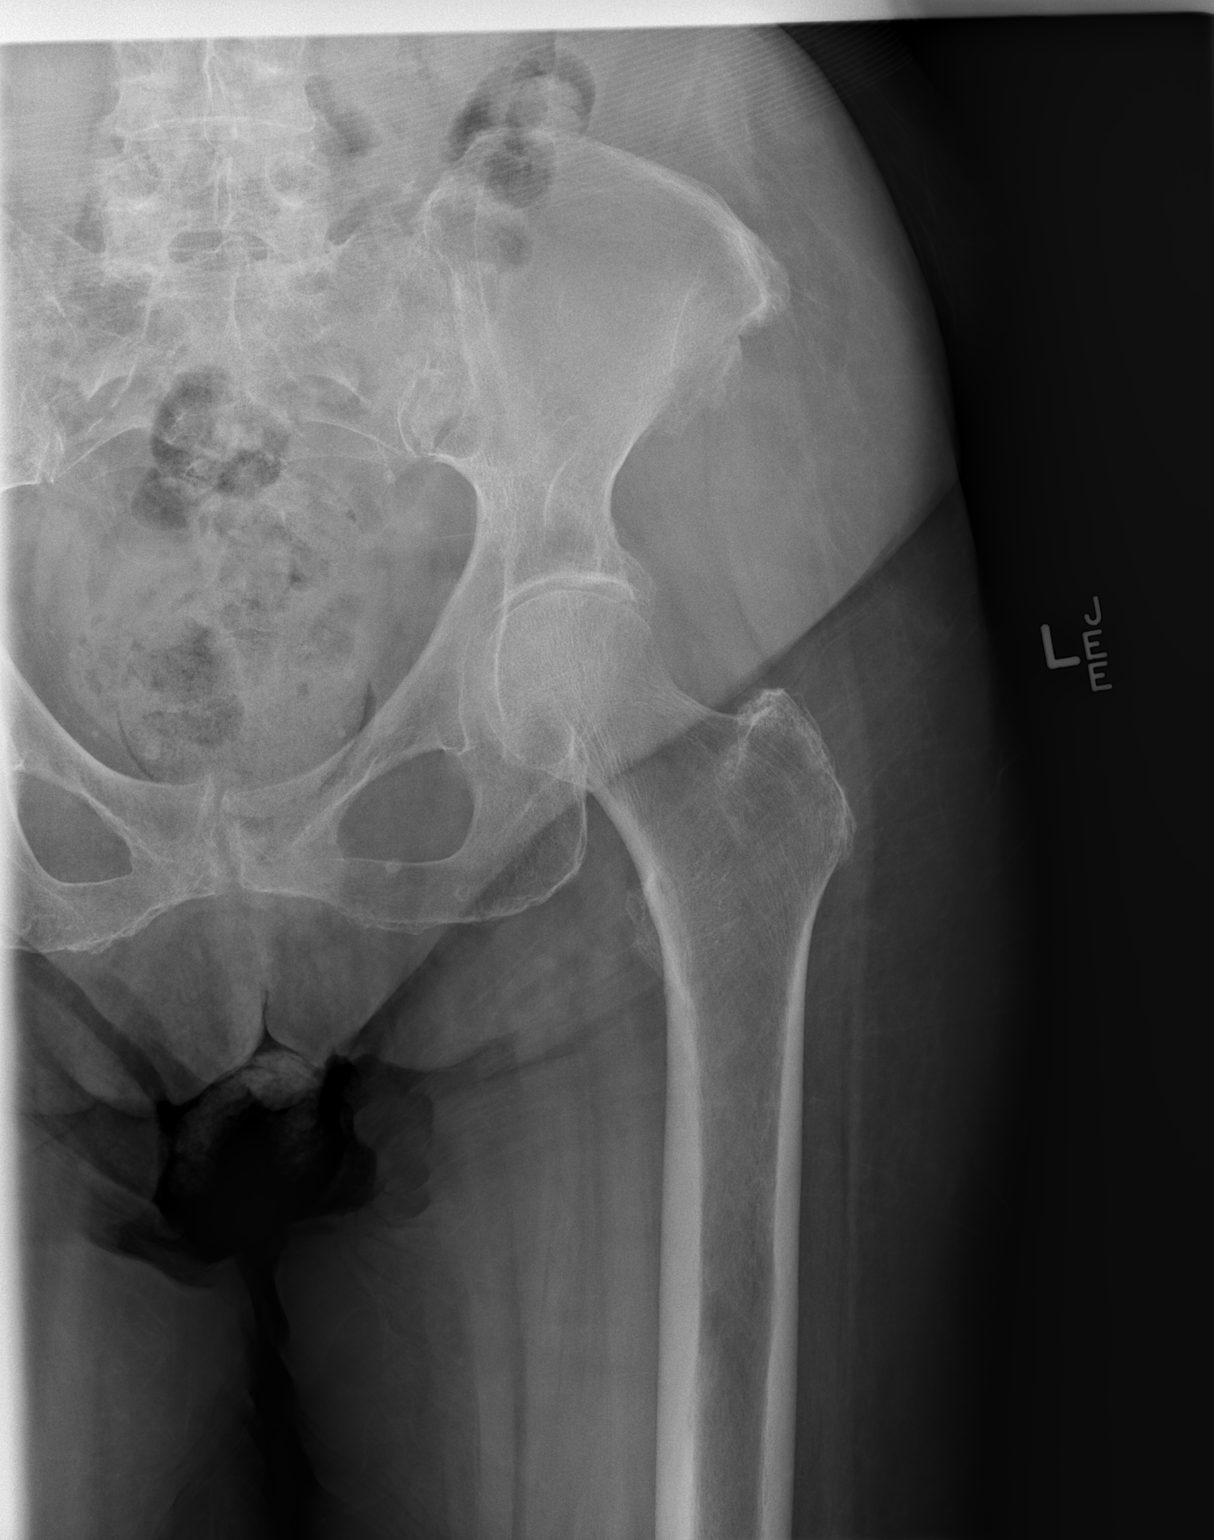

[t hip frog leg left]
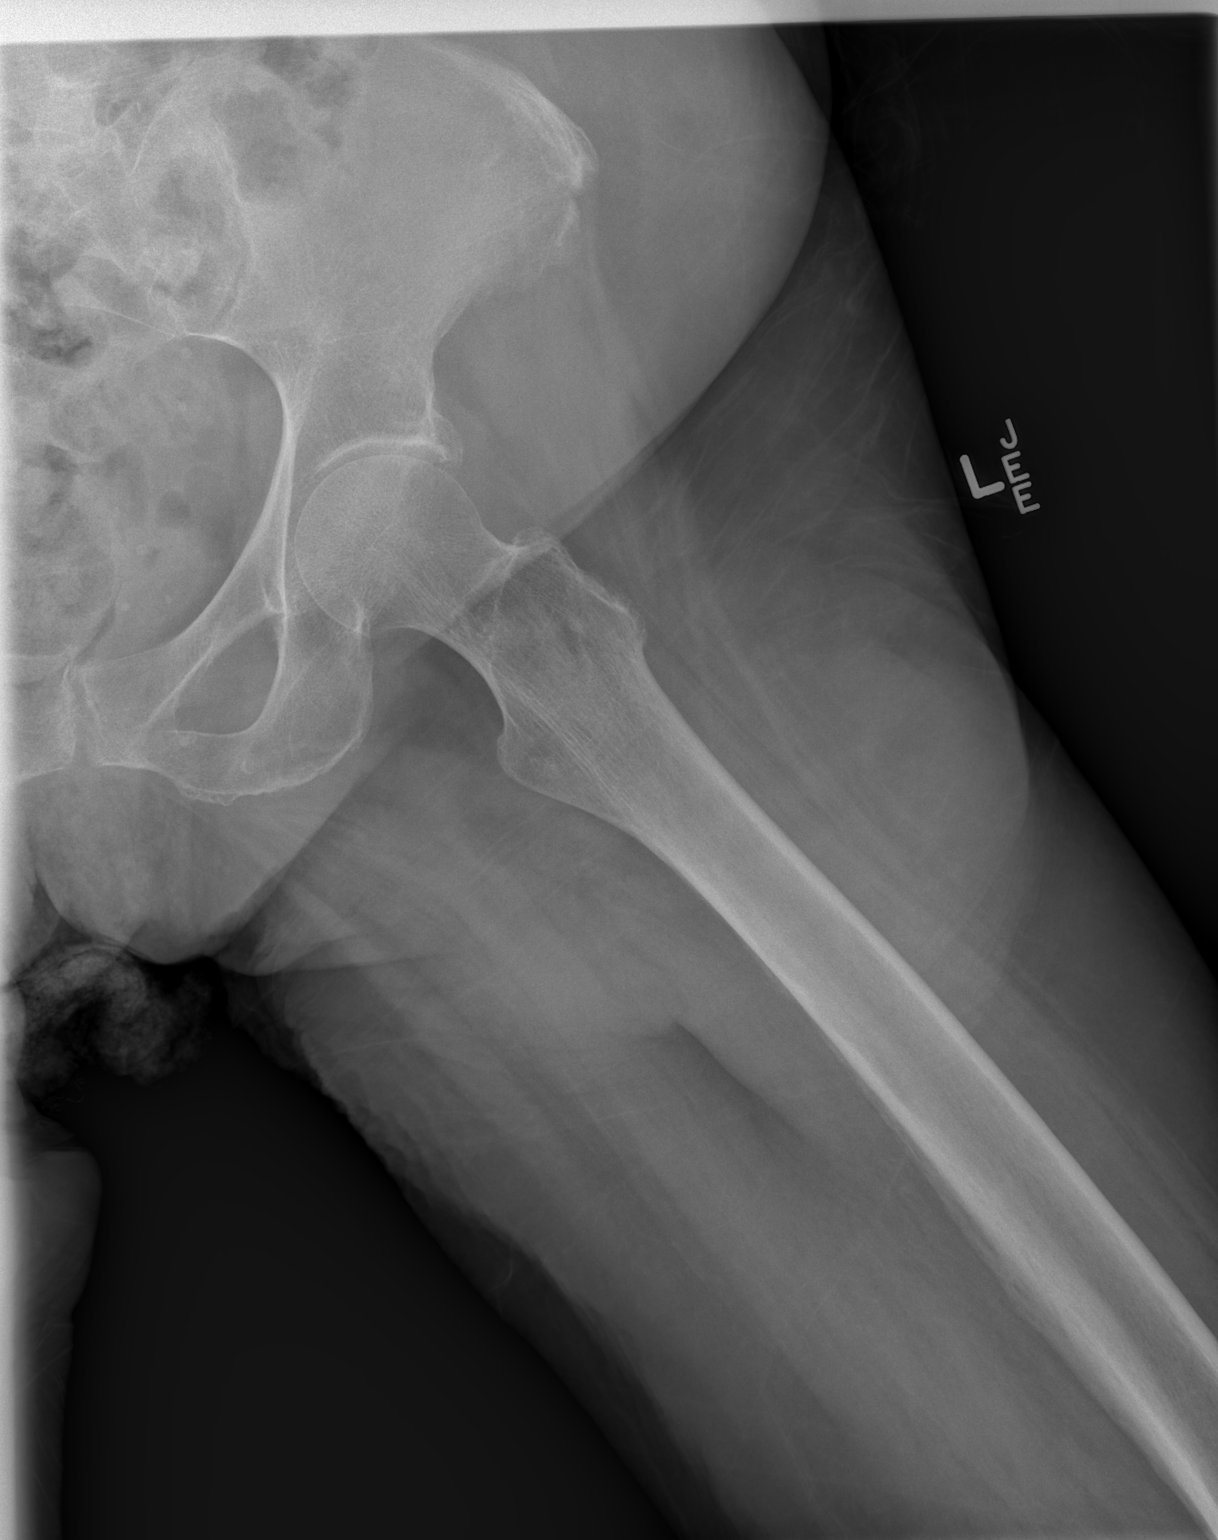

[3 of 3 positions shown; findings below may reference images not displayed]

FINDINGS: Frontal pelvis as well as frontal and lateral left hip joint images
were obtained. There is moderate symmetric narrowing of both hip
joints. No fracture or dislocation. No erosive change. Bones are
osteoporotic.
IMPRESSION: Osteoarthritic change in both hip joints. Bones osteoporotic. No
acute fracture or dislocation.

## 2015-10-30 IMAGING — CT CT ANGIO CHEST
2 of 8 series · 19 of 46 positions shown · IV contrast (APPLIED)
Comparison: Chest radiograph October 29, 2013

CLINICAL DATA: Difficulty breathing

EXAM:
CT ANGIOGRAPHY CHEST WITH CONTRAST
TECHNIQUE: Multidetector CT imaging of the chest was performed using the
standard protocol during bolus administration of intravenous
contrast. Multiplanar CT image reconstructions including MIPs were
obtained to evaluate the vascular anatomy.
CONTRAST:  54mL OMNIPAQUE IOHEXOL 350 MG/ML SOLN

[Series 5: thins · axial · 0.59mm/px · z∈[+759,+967]mm · 16 of 230 slices shown]
[im 11/230  lung]
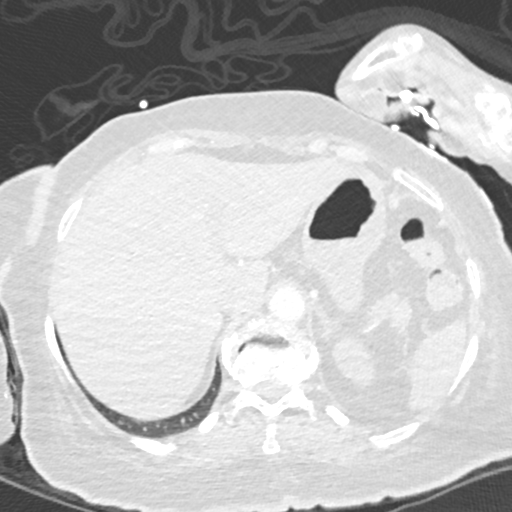
[im 21/230  soft-tissue]
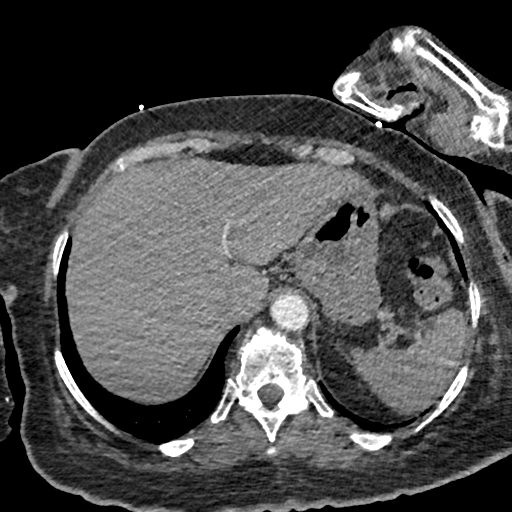
[im 42/230  lung]
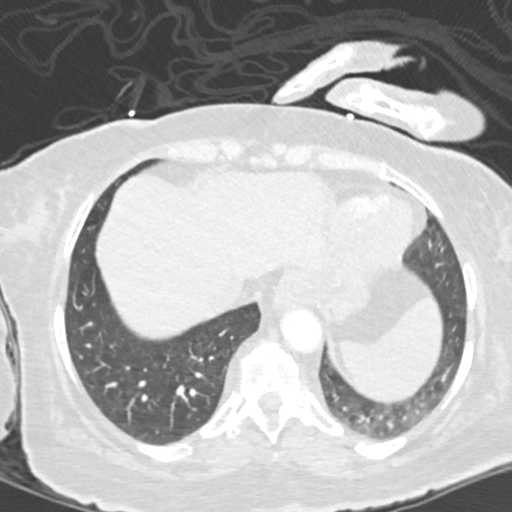
[im 53/230  soft-tissue]
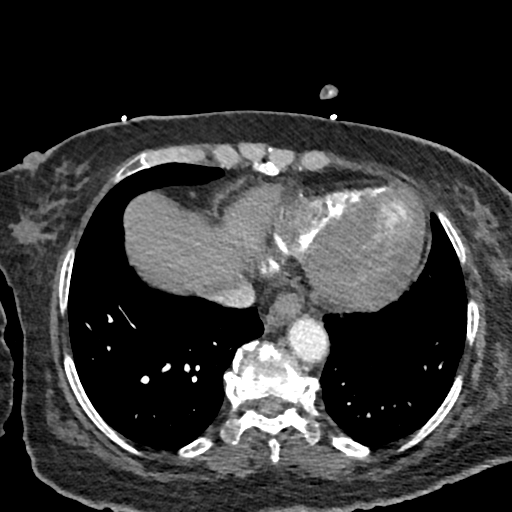
[im 63/230  lung]
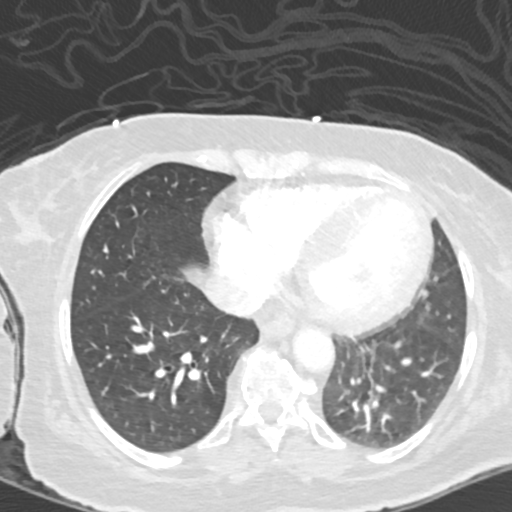
[im 84/230  soft-tissue]
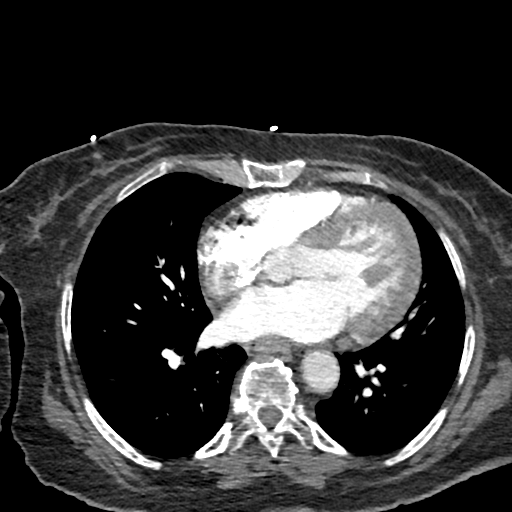
[im 94/230  lung]
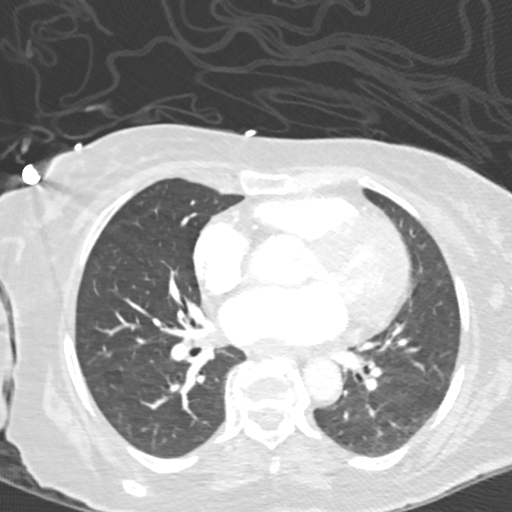
[im 105/230  soft-tissue]
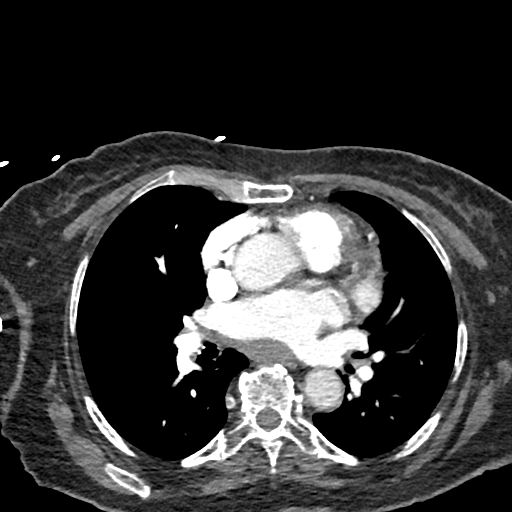
[im 125/230  lung]
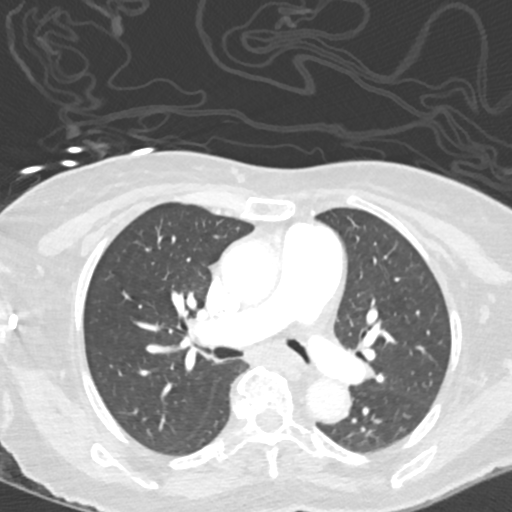
[im 136/230  soft-tissue]
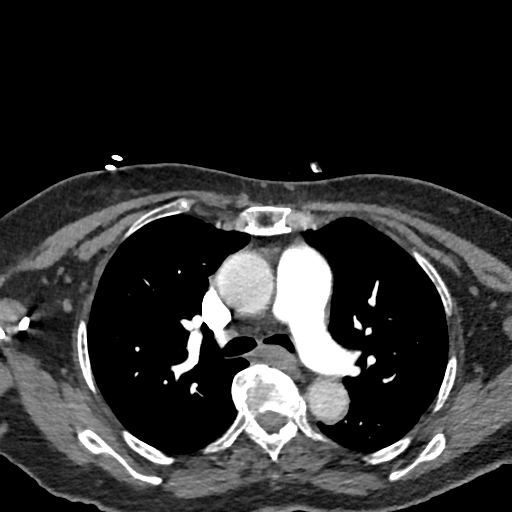
[im 146/230  lung]
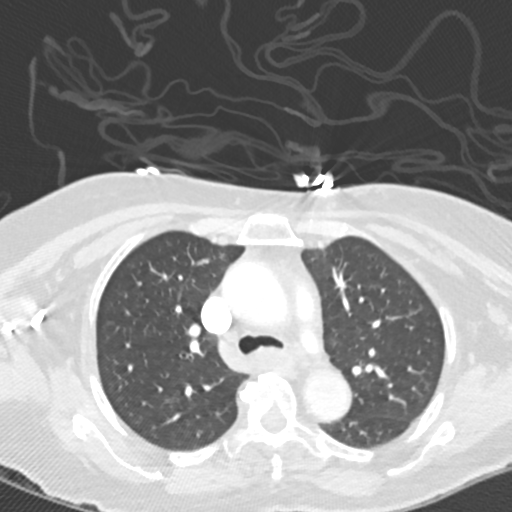
[im 167/230  soft-tissue]
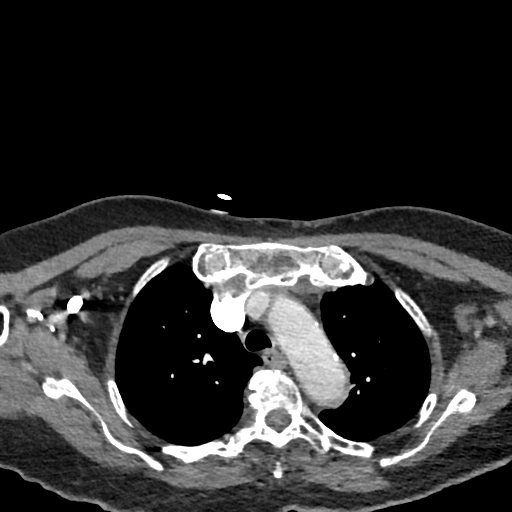
[im 177/230  lung]
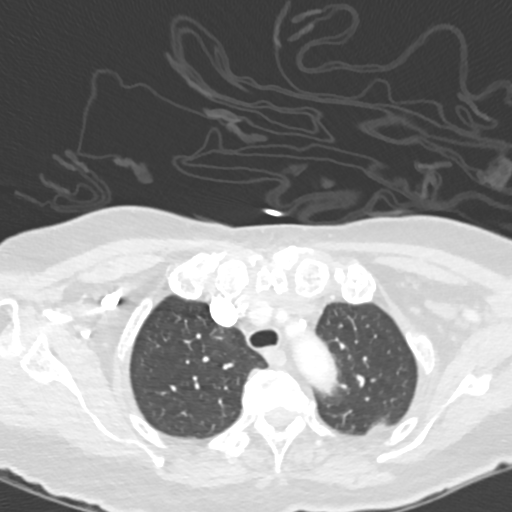
[im 188/230  soft-tissue]
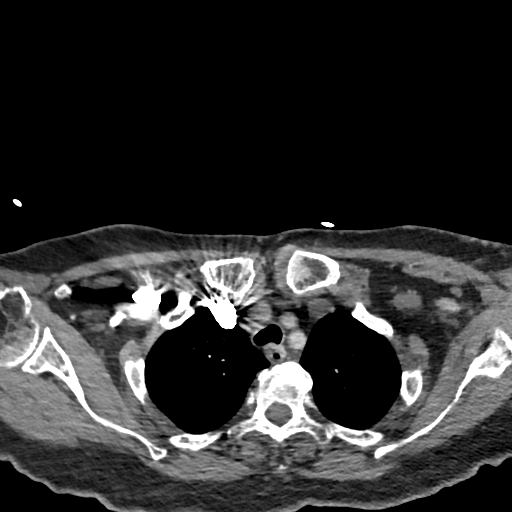
[im 209/230  lung]
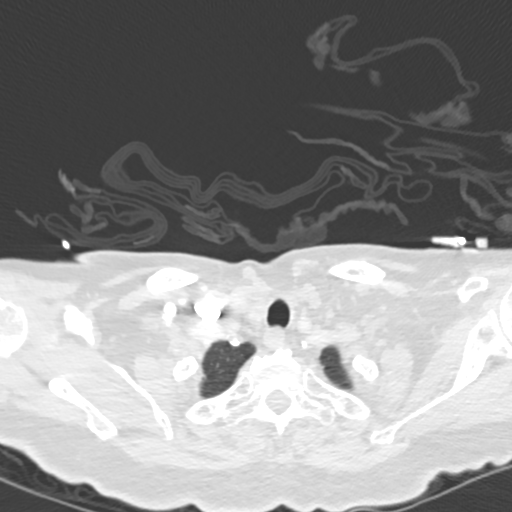
[im 219/230  soft-tissue]
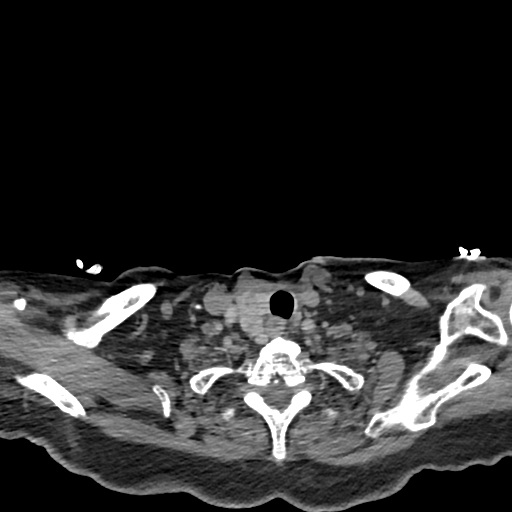

[Series 7: coronal mpr · coronal · 0.59mm/px · 3 of 99 slices shown]
[im 25/99  soft-tissue]
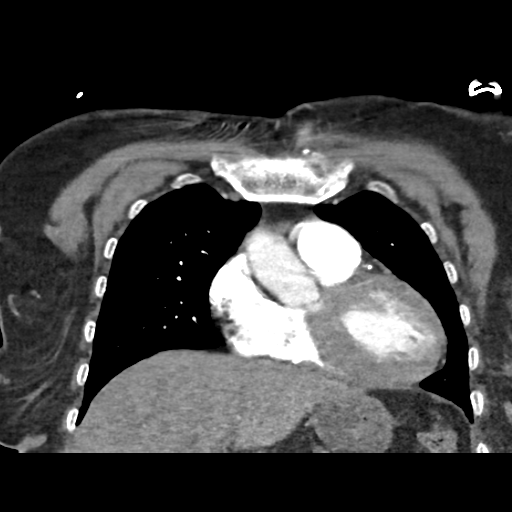
[im 50/99  soft-tissue]
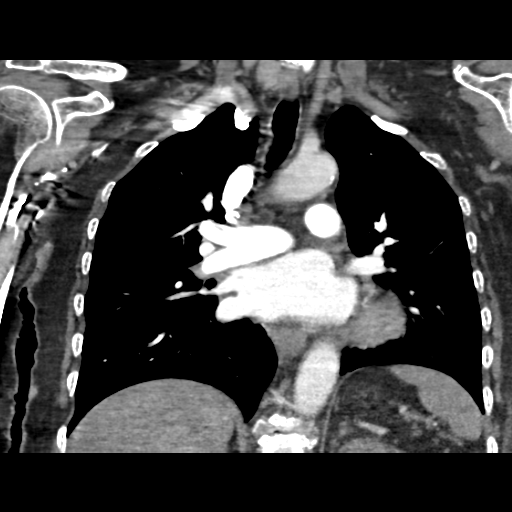
[im 74/99  soft-tissue]
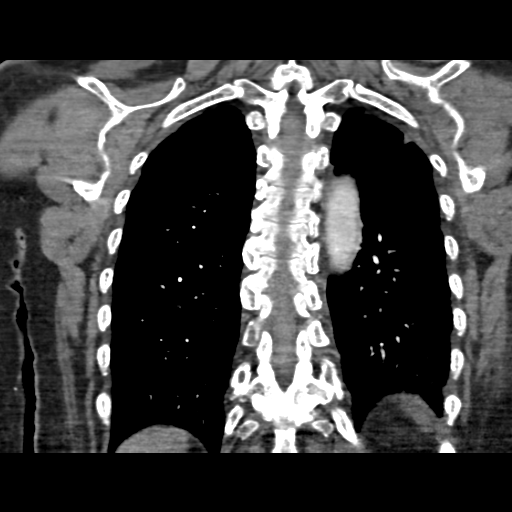

[19 of 46 positions shown; findings below may reference images not displayed]

FINDINGS: There is no demonstrable pulmonary embolus. There is slight
dilatation of the ascending thoracic aorta without frank aneurysm.
There is no aortic dissection.

There is slight atelectatic change in the left base. There is no
edema or consolidation.

There is no appreciable thoracic adenopathy. The pericardium is not
thickened.

There is thickening of the wall of the midesophagus with mild
dilatation of the esophagus in this area.

Visualized upper abdominal structures appear normal. There is
degenerative change throughout the thoracic spine. There are no
blastic or lytic bone lesions.

The left lobe of the thyroid is hypoplastic. The right lobe of the
thyroid is incompletely visualized. It does appear inhomogeneous
with questionable nodular lesions.

Review of the MIP images confirms the above findings.
IMPRESSION: No demonstrable pulmonary embolus.  No edema or consolidation.

Soft tissue fullness and thickening in the mid esophagus. This
finding may indicate inflammatory lesion or conceivably infiltrating
neoplasm. Direct visualization of the esophagus is warranted given
this appearance.

Suspect multinodular goiter. Note that thyroid is incompletely
visualized. Nonemergent thyroid ultrasound advised.

No apparent adenopathy.

Mild prominence of the ascending thoracic aorta without frank
aneurysm.
